# Patient Record
Sex: Female | Born: 1970 | Race: White | Hispanic: No | Marital: Married | Smoking: Former smoker
Health system: Southern US, Community
[De-identification: ages and names within clinical notes are randomized; demographics above are authoritative.]

## PROBLEM LIST (undated history)

## (undated) DIAGNOSIS — M7732 Calcaneal spur, left foot: Secondary | ICD-10-CM

## (undated) DIAGNOSIS — M79671 Pain in right foot: Secondary | ICD-10-CM

## (undated) DIAGNOSIS — E079 Disorder of thyroid, unspecified: Secondary | ICD-10-CM

## (undated) DIAGNOSIS — M216X1 Other acquired deformities of right foot: Secondary | ICD-10-CM

## (undated) DIAGNOSIS — R51 Headache: Secondary | ICD-10-CM

## (undated) DIAGNOSIS — R519 Headache, unspecified: Secondary | ICD-10-CM

## (undated) DIAGNOSIS — G8929 Other chronic pain: Secondary | ICD-10-CM

## (undated) DIAGNOSIS — K219 Gastro-esophageal reflux disease without esophagitis: Secondary | ICD-10-CM

## (undated) DIAGNOSIS — N871 Moderate cervical dysplasia: Secondary | ICD-10-CM

## (undated) DIAGNOSIS — M7731 Calcaneal spur, right foot: Secondary | ICD-10-CM

## (undated) DIAGNOSIS — M7661 Achilles tendinitis, right leg: Secondary | ICD-10-CM

## (undated) DIAGNOSIS — IMO0002 Reserved for concepts with insufficient information to code with codable children: Secondary | ICD-10-CM

## (undated) DIAGNOSIS — E892 Postprocedural hypoparathyroidism: Secondary | ICD-10-CM

## (undated) DIAGNOSIS — E039 Hypothyroidism, unspecified: Secondary | ICD-10-CM

## (undated) HISTORY — DX: Headache, unspecified: R51.9

## (undated) HISTORY — DX: Other chronic pain: G89.29

## (undated) HISTORY — DX: Moderate cervical dysplasia: N87.1

## (undated) HISTORY — DX: Reserved for concepts with insufficient information to code with codable children: IMO0002

## (undated) HISTORY — DX: Disorder of thyroid, unspecified: E07.9

## (undated) HISTORY — PX: TUBAL LIGATION: SHX77

## (undated) HISTORY — DX: Headache: R51

## (undated) HISTORY — PX: ABDOMINAL HYSTERECTOMY: SHX81

## (undated) HISTORY — PX: OTHER SURGICAL HISTORY: SHX169

---

## 1995-09-16 HISTORY — PX: TONSILLECTOMY: SUR1361

## 2010-03-11 ENCOUNTER — Ambulatory Visit: Payer: Self-pay

## 2010-08-20 ENCOUNTER — Ambulatory Visit: Payer: Self-pay | Admitting: Otolaryngology

## 2011-09-12 ENCOUNTER — Emergency Department: Payer: Self-pay | Admitting: Emergency Medicine

## 2011-09-16 HISTORY — PX: CHOLECYSTECTOMY: SHX55

## 2011-11-26 ENCOUNTER — Ambulatory Visit: Payer: Self-pay | Admitting: Otolaryngology

## 2011-12-09 ENCOUNTER — Other Ambulatory Visit: Payer: Self-pay | Admitting: Otolaryngology

## 2011-12-09 LAB — PROTIME-INR
INR: 0.9
Prothrombin Time: 12.6 secs (ref 11.5–14.7)

## 2011-12-09 LAB — PLATELET COUNT: Platelet: 207 10*3/uL (ref 150–440)

## 2011-12-09 LAB — APTT: Activated PTT: 26.9 secs (ref 23.6–35.9)

## 2011-12-11 ENCOUNTER — Ambulatory Visit: Payer: Self-pay | Admitting: Otolaryngology

## 2012-04-12 ENCOUNTER — Ambulatory Visit: Payer: Self-pay | Admitting: Otolaryngology

## 2012-04-12 LAB — PREGNANCY, URINE: Pregnancy Test, Urine: NEGATIVE m[IU]/mL

## 2012-04-12 LAB — CALCIUM: Calcium, Total: 8.3 mg/dL — ABNORMAL LOW (ref 8.5–10.1)

## 2012-04-14 LAB — PATHOLOGY REPORT

## 2014-10-24 ENCOUNTER — Emergency Department: Payer: Self-pay | Admitting: Emergency Medicine

## 2014-11-29 DIAGNOSIS — J309 Allergic rhinitis, unspecified: Secondary | ICD-10-CM | POA: Insufficient documentation

## 2014-11-29 DIAGNOSIS — K649 Unspecified hemorrhoids: Secondary | ICD-10-CM | POA: Insufficient documentation

## 2014-11-29 DIAGNOSIS — G44219 Episodic tension-type headache, not intractable: Secondary | ICD-10-CM | POA: Insufficient documentation

## 2014-11-29 DIAGNOSIS — E890 Postprocedural hypothyroidism: Secondary | ICD-10-CM | POA: Insufficient documentation

## 2014-11-29 DIAGNOSIS — G44221 Chronic tension-type headache, intractable: Secondary | ICD-10-CM | POA: Insufficient documentation

## 2014-11-29 DIAGNOSIS — J3089 Other allergic rhinitis: Secondary | ICD-10-CM | POA: Insufficient documentation

## 2015-01-02 NOTE — Op Note (Signed)
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   PROCEDURE PERFORMED: Minimally invasive endoscopic-assisted total thyroidectomy with laryngeal nerve monitoring.   SURGEON: Bud Facereighton Kineta Fudala, MD   ASSISTANT: Karlyne GreenspanWilliam Idan Prime, MD  ANESTHESIA: General endotracheal anesthesia.   ESTIMATED BLOOD LOSS: 25 mL.   IV FLUIDS: Please see anesthesia record.   COMPLICATIONS: None.   DRAINS/STENT PLACEMENTS: Bilateral Surgicel.   SPECIMENS: Total thyroid.   COMPLICATIONS: None.  INDICATIONS FOR PROCEDURE: The patient is a 44 year old female with history of a nontoxic multinodular goiter status post FNA biopsy, which was benign, but the patient continued to have compressive symptoms and enlargement of the goiter.   OPERATIVE FINDINGS: Bilateral recurrent laryngeal nerves were identified and preserved and stimulated at the end of the case. The right superior, inferior and the left inferior parathyroid glands were identified and preserved.   DESCRIPTION OF PROCEDURE: After the patient was identified in holding, benefits and risks of the procedure were discussed as well as normal postoperative course, the patient was taken to the Operating Room and placed in the supine position. General endotracheal anesthesia was induced. The patient was rotated 180 degrees. The patient was prepped and draped in sterile fashion. A previously marked anterior neck crease was injected with 6.5 mL 0.25% Marcaine with 1:200,000 epinephrine. A 15 blade scalpel was used to make an incision in an already existing skin crease and dissection was carried through subcutaneous tissues down to the level of the strap muscles using Bovie electrocautery. Strap muscles were divided along  their median raphe using Bovie electrocautery and the anterior border of the thyroid gland was encountered. The left sternohyoid and sternothyroid muscles were separated from the thyroid gland. This revealed a large goiter with extensive fibrotic scarring most likely from previous biopsies. At this time, under endoscopic visualization, the superior pole was identified and dissection was performed laterally as well as medially to the superior pole and using a Harmonic scalpel the superior pole vessels were ligated and the superior pole was mobilized. The left hemithyroid was continued to be mobilized superiorly and inferiorly and the recurrent laryngeal nerve was identified in its normal anatomical position. This was reinforced with bipolar stimulation with appropriate response. The inferior parathyroid gland was identified in its normal anatomical position. At this time, the remaining attachments of the left hemithyroid were dissected away from the trachea and the recurrent laryngeal nerve using a combination of Bovie and Harmonic scalpel. At this time, attention was directed to patient's right hemithyroid. The sternohyoid and sternothyroid muscles were separated from the anterior border of the right hemithyroid. The right hemithyroid was mobilized inferiorly and superiorly. The superior vessels were isolated laterally and medially through combination of blunt and sharp techniques. Under endoscopic visualization, the patient's right superior pole was mobilized and Harmonic scalpel was used to ligate the superior vessels. Of note there was extensive fibrotic scarring of the overlying strap muscles to the anterior border of the right hemithyroid most likely from previous biopsy sites. At this time, the thyroid was delivered through the wound and the recurrent laryngeal nerve was identified in its normal anatomical position in the tracheoesophageal groove entering into the larynx. The patient's recurrent laryngeal  nerve was stimulated robustly and the superior and inferior parathyroid glands were identified and protected and the remaining attachments of the right  hemithyroid were separated. A small cuff of tissue was needed to be left as this was scarred down to the nerve at its entrance into the patient's larynx. At this time, the remaining attachments of the isthmus and the right hemithyroid were separated using a combination of Bovie electrocautery and Harmonic scalpel and the specimen was passed off the table with a marking stitch in the left superior pole. At this time, the patient's wound was copiously irrigated and meticulous hemostasis was achieved. Surgicel was placed along the area of Berry's ligament bilaterally. Strap muscles were reapproximated with a single figure-of-eight stitch, the skin was reapproximated with three interrupted subdermal stitches, and the skin was closed with Dermabond skin adhesive and topped with a Steri-Strip. At this time, care of the patient was transferred to anesthesia.  ____________________________ Kyung Rudd, MD ccv:slb D: 04/12/2012 10:36:34 ET T: 04/12/2012 12:46:52 ET JOB#: [id number]  cc: Kyung Rudd, MD, <Dictator> Kyung Rudd MD ELECTRONICALLY SIGNED 04/12/2012 17:13

## 2015-01-05 ENCOUNTER — Ambulatory Visit
Admit: 2015-01-05 | Disposition: A | Discharge status: Home / Self Care | Payer: Self-pay | Attending: Internal Medicine | Admitting: Internal Medicine

## 2015-01-14 DIAGNOSIS — IMO0002 Reserved for concepts with insufficient information to code with codable children: Secondary | ICD-10-CM

## 2015-01-14 HISTORY — DX: Reserved for concepts with insufficient information to code with codable children: IMO0002

## 2015-02-14 DIAGNOSIS — N871 Moderate cervical dysplasia: Secondary | ICD-10-CM

## 2015-02-14 HISTORY — DX: Moderate cervical dysplasia: N87.1

## 2015-05-03 ENCOUNTER — Encounter: Payer: Self-pay | Admitting: Obstetrics and Gynecology

## 2015-05-03 ENCOUNTER — Ambulatory Visit (INDEPENDENT_AMBULATORY_CARE_PROVIDER_SITE_OTHER): Payer: Managed Care, Other (non HMO) | Admitting: Obstetrics and Gynecology

## 2015-05-03 VITALS — BP 116/73 | HR 89 | Ht 64.0 in | Wt 184.1 lb

## 2015-05-03 DIAGNOSIS — D069 Carcinoma in situ of cervix, unspecified: Secondary | ICD-10-CM

## 2015-05-03 NOTE — Patient Instructions (Signed)
1.  Avoid tobacco products. 2.  Maintain monogamous state. 3.  LEEP cone biopsy is recommended treatment for CIN 2-3With 90% success after 1 treatment, 99% success after 2 treatments (if necessary)

## 2015-05-03 NOTE — Progress Notes (Signed)
Patient ID: Abigail Lawson, female   DOB: Oct 22, 1970, 44 y.o.   MRN: [id number]   Wants options   New patient evaluation:  Chief complaint: 1.  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  The patient is a 44 year old married, white, female, para 39, using BTL for contraception, who presents for second opinion regarding management of high-grade dysplasia.  Hgsil/pos pap -01/2015- no pap in 15 years Seen at [facility] colpo- 02/2015- showed cin 2-3  GYN history: Cycles regular on a monthly basis. No significant dysmenorrhea. No menorrhagia; bleeding lasts 5-7 days. No history of chronic pelvic pain or deep thrusting dyspareunia. No history of prior abnormal Pap smears.  Review of notes from [facility] demonstrates the patient to have CIN 2-3 on colposcopic directed biopsies.  Colposcopy showed dense white epithelium circumferentially.  Past Medical History  Diagnosis Date  . 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 Thyroid disease   . Chronic headaches    Past Surgical History  Procedure Laterality Date  . Tubal ligation    . Cholecystectomy  2013  . Thyroidectomy      total  . Tonsillectomy  1997    Options were given regarding management including LEEP cone biopsy, cold knife conization, and vaginal hysterectomy.  The patient opted for LEEP cone biopsy. However, she presents today because of multiple questions regarding her condition.  IMPRESSION: 1.  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  PLAN: 1.  HPV pathophysiology, medical management, and surgical treatment of high-grade dysplasia was reviewed. 2.  Avoid tobacco products. 3.  Maintain monogamous state. 4.  Optimize immunity through healthy lifestyle. 5.  LEEP cone biopsy is my recommended treatment for this condition considering the patient does not have other gynecologic issues  warranting further aggressive treatment.  (I.e. Hysterectomy).  The patient does understand that LEEP cone biopsy or cold knife conization confers 90% success in treatment; 2 procedures would confer 99% success in treatment.  A total of 20 minutes were spent face-to-face with the patient during the encounter with greater than 50% dealing with counseling and coordination of care.  Herold Harms, MD

## 2015-07-02 NOTE — H&P (Signed)
  Chief Complaint:    Ms. Abigail Lawson is a 44 y.o. female here forTVH and bilateral salpingectomy for a LEEP that showed extensive cx dysplasia cin 2-3 deep into the hat specimen and + ECC   Past Medical History:  has a past medical history of Thyroid disease; Chronic headaches; and H/O thyroid nodule.  Past Surgical History:  has past surgical history that includes Thyroidectomy Total (03/2012); Cholecystectomy (2013); Tubal ligation; and Cervical biopsy w/ loop electrode excision (05/17/15). Family History: family history includes Cancer in her father; No Known Problems in her mother. Social History:  reports that she has quit smoking. She has never used smokeless tobacco. She reports that she does not drink alcohol or use illicit drugs. OB/GYN History:  OB History    Gravida Para Term Preterm AB TAB SAB Ectopic Multiple Living   4 3 3  1  1   3       Allergies: has No Known Allergies. Medications:  Current outpatient prescriptions:  . calcitRIOL (ROCALTROL) 0.25 MCG capsule, Take 0.25 mcg by mouth once daily., Disp: , Rfl:  . CALCIUM CARBONATE (CALCIUM 500 ORAL), Take 500 mg by mouth once daily., Disp: , Rfl:  . levothyroxine (SYNTHROID, LEVOTHROID) 100 MCG tablet, Take 100 mcg by mouth once daily. Take on an empty stomach with a glass of water at least 30-60 minutes before breakfast., Disp: , Rfl:  . HYDROcodone-acetaminophen (VICODIN) 5-300 mg Tab tablet, Take 1 tablet by mouth every 6 (six) minutes as needed., Disp: 15 tablet, Rfl: 0  Review of Systems: General:   No fatigue or weight loss Eyes:   No vision changes Ears:   No hearing difficulty Respiratory:   No cough or shortness of breath Pulmonary:   No asthma or shortness of breath Cardiovascular:  No chest pain, palpitations, dyspnea on exertion Gastrointestinal:  No abdominal bloating, chronic diarrhea, constipations, masses, pain or hematochezia Genitourinary:  No hematuria, dysuria,  abnormal vaginal discharge, pelvic pain, Menometrorrhagia+ CIN 2-3  Lymphatic:  No swollen lymph nodes Musculoskeletal: No muscle weakness Neurologic:  No extremity weakness, syncope, seizure disorder Psychiatric:  No history of depression, delusions or suicidal/homicidal ideation   Exam:      Filed Vitals:   07/03/15  BP: 102/72  Pulse: 80    Body mass index is 31.4 kg/(m^2).  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 Has been counseled regarding the risks and benefits and wishes to proceed with the surgery        Vilma PraderHOMAS JANSE Renalda Locklin, MD

## 2015-07-03 ENCOUNTER — Encounter
Admission: RE | Admit: 2015-07-03 | Discharge: 2015-07-03 | Disposition: A | Discharge status: Home / Self Care | Payer: Managed Care, Other (non HMO) | Source: Ambulatory Visit | Attending: Obstetrics and Gynecology | Admitting: Obstetrics and Gynecology

## 2015-07-03 DIAGNOSIS — Z01818 Encounter for other preprocedural examination: Secondary | ICD-10-CM | POA: Diagnosis present

## 2015-07-03 DIAGNOSIS — N879 Dysplasia of cervix uteri, unspecified: Secondary | ICD-10-CM | POA: Insufficient documentation

## 2015-07-03 HISTORY — DX: Hypothyroidism, unspecified: E03.9

## 2015-07-03 HISTORY — DX: Gastro-esophageal reflux disease without esophagitis: K21.9

## 2015-07-03 LAB — TYPE AND SCREEN
ABO/RH(D): B NEG
Antibody Screen: NEGATIVE

## 2015-07-03 LAB — BASIC METABOLIC PANEL
ANION GAP: 8 (ref 5–15)
BUN: 9 mg/dL (ref 6–20)
CALCIUM: 7.8 mg/dL — AB (ref 8.9–10.3)
CO2: 27 mmol/L (ref 22–32)
CREATININE: 0.87 mg/dL (ref 0.44–1.00)
Chloride: 104 mmol/L (ref 101–111)
GLUCOSE: 92 mg/dL (ref 65–99)
Potassium: 4 mmol/L (ref 3.5–5.1)
Sodium: 139 mmol/L (ref 135–145)

## 2015-07-03 LAB — CBC
HCT: 33.8 % — ABNORMAL LOW (ref 35.0–47.0)
HEMOGLOBIN: 11.2 g/dL — AB (ref 12.0–16.0)
MCH: 26.6 pg (ref 26.0–34.0)
MCHC: 33 g/dL (ref 32.0–36.0)
MCV: 80.5 fL (ref 80.0–100.0)
PLATELETS: 225 10*3/uL (ref 150–440)
RBC: 4.2 MIL/uL (ref 3.80–5.20)
RDW: 14.5 % (ref 11.5–14.5)
WBC: 4.8 10*3/uL (ref 3.6–11.0)

## 2015-07-03 LAB — ABO/RH: ABO/RH(D): B NEG

## 2015-07-03 NOTE — Patient Instructions (Addendum)
  Your procedure is scheduled on: 06/2415 Mon Report to Day Surgery.2nd floor [facility] To find out your arrival time please call [phone] between 1PM - 3PM on 07/06/15 Fri Remember: Instructions that are not followed completely may result in serious medical risk, up to and including death, or upon the discretion of your surgeon and anesthesiologist your surgery may need to be rescheduled.    _x___ 1. Do not eat food or drink liquids after midnight. No gum chewing or hard candies.     __x__ 2. No Alcohol for 24 hours before or after surgery.   ____ 3. Bring all medications with you on the day of surgery if instructed.    ___x_ 4. Notify your doctor if there is any change in your medical condition     (cold, fever, infections).     Do not wear jewelry, make-up, hairpins, clips or nail polish.  Do not wear lotions, powders, or perfumes. You may wear deodorant.  Do not shave 48 hours prior to surgery. Men may shave face and neck.  Do not bring valuables to the hospital.    [facility] any belongings or valuables.               Contacts, dentures or bridgework may not be worn into surgery.  Leave your suitcase in the car. After surgery it may be brought to your room.  For patients admitted to the hospital, discharge time is determined by your                treatment team.   Patients discharged the day of surgery will not be allowed to drive home.   Please read over the following fact sheets that you were given:      __x__ Take these medicines the morning of surgery with A SIP OF WATER:    1. famotidine (PEPCID) 10 MG tablet  2. levothyroxine (SYNTHROID, LEVOTHROID) 100 MCG tablet  3.   4.  5.  6.  __x__ Fleet Enema (as directed)   _x___ Use CHG Soap as directed  ____ Use inhalers on the day of surgery  ____ Stop metformin 2 days prior to surgery    ____ Take 1/2 of usual insulin dose the night before surgery and none on the morning of  surgery.   ____ Stop Coumadin/Plavix/aspirin on   _x___ Stop Anti-inflammatories on Only take Tylenol as needed   ____ Stop supplements until after surgery.    ____ Bring C-Pap to the hospital.

## 2015-07-03 NOTE — Pre-Procedure Instructions (Signed)
Faxed calcium result to Dr Feliberto GottronSchermerhorn.

## 2015-07-09 ENCOUNTER — Ambulatory Visit: Payer: Managed Care, Other (non HMO) | Admitting: Certified Registered Nurse Anesthetist

## 2015-07-09 ENCOUNTER — Encounter: Payer: Self-pay | Admitting: *Deleted

## 2015-07-09 ENCOUNTER — Encounter
Admission: RE | Disposition: A | Discharge status: Home / Self Care | Payer: Self-pay | Source: Ambulatory Visit | Attending: Obstetrics and Gynecology

## 2015-07-09 ENCOUNTER — Observation Stay
Admission: RE | Admit: 2015-07-09 | Discharge: 2015-07-10 | Disposition: A | Discharge status: Home / Self Care | Payer: Managed Care, Other (non HMO) | Source: Ambulatory Visit | Attending: Obstetrics and Gynecology | Admitting: Obstetrics and Gynecology

## 2015-07-09 DIAGNOSIS — N800 Endometriosis of uterus: Secondary | ICD-10-CM | POA: Diagnosis not present

## 2015-07-09 DIAGNOSIS — Z79899 Other long term (current) drug therapy: Secondary | ICD-10-CM | POA: Insufficient documentation

## 2015-07-09 DIAGNOSIS — K219 Gastro-esophageal reflux disease without esophagitis: Secondary | ICD-10-CM | POA: Diagnosis not present

## 2015-07-09 DIAGNOSIS — Z809 Family history of malignant neoplasm, unspecified: Secondary | ICD-10-CM | POA: Insufficient documentation

## 2015-07-09 DIAGNOSIS — D069 Carcinoma in situ of cervix, unspecified: Secondary | ICD-10-CM | POA: Diagnosis present

## 2015-07-09 DIAGNOSIS — Z9049 Acquired absence of other specified parts of digestive tract: Secondary | ICD-10-CM | POA: Insufficient documentation

## 2015-07-09 DIAGNOSIS — E890 Postprocedural hypothyroidism: Secondary | ICD-10-CM | POA: Insufficient documentation

## 2015-07-09 DIAGNOSIS — N831 Corpus luteum cyst of ovary, unspecified side: Secondary | ICD-10-CM | POA: Insufficient documentation

## 2015-07-09 DIAGNOSIS — Z87891 Personal history of nicotine dependence: Secondary | ICD-10-CM | POA: Diagnosis not present

## 2015-07-09 DIAGNOSIS — Z9889 Other specified postprocedural states: Secondary | ICD-10-CM

## 2015-07-09 HISTORY — PX: OOPHORECTOMY: SHX6387

## 2015-07-09 HISTORY — PX: VAGINAL HYSTERECTOMY: SHX2639

## 2015-07-09 HISTORY — PX: BILATERAL SALPINGECTOMY: SHX5743

## 2015-07-09 LAB — POCT PREGNANCY, URINE: Preg Test, Ur: NEGATIVE

## 2015-07-09 SURGERY — HYSTERECTOMY, VAGINAL
Anesthesia: General | Laterality: Right

## 2015-07-09 MED ORDER — MORPHINE SULFATE 2 MG/ML IV SOLN: INTRAVENOUS | Status: DC

## 2015-07-09 MED ORDER — ONDANSETRON HCL 4 MG/2ML IJ SOLN
INTRAMUSCULAR | Status: DC | PRN
  Administered 2015-07-09: 4 mg via INTRAVENOUS

## 2015-07-09 MED ORDER — LACTATED RINGERS IV SOLN: INTRAVENOUS | Status: DC

## 2015-07-09 MED ORDER — FENTANYL CITRATE (PF) 100 MCG/2ML IJ SOLN
INTRAMUSCULAR | Status: DC | PRN
  Administered 2015-07-09 (×2): 100 ug via INTRAVENOUS
  Administered 2015-07-09: 50 ug via INTRAVENOUS

## 2015-07-09 MED ORDER — CEFOXITIN SODIUM-DEXTROSE 2-2.2 GM-% IV SOLR (PREMIX)
INTRAVENOUS | Status: AC
  Filled 2015-07-09: qty 50

## 2015-07-09 MED ORDER — LIDOCAINE-EPINEPHRINE 1 %-1:100000 IJ SOLN
INTRAMUSCULAR | Status: AC
  Filled 2015-07-09: qty 1

## 2015-07-09 MED ORDER — DIPHENHYDRAMINE HCL 12.5 MG/5ML PO ELIX
12.5000 mg | ORAL_SOLUTION | Freq: Four times a day (QID) | ORAL | Status: DC | PRN
Start: 2015-07-09 — End: 2015-07-10
  Filled 2015-07-09: qty 5

## 2015-07-09 MED ORDER — ROCURONIUM BROMIDE 100 MG/10ML IV SOLN
INTRAVENOUS | Status: DC | PRN
  Administered 2015-07-09: 10 mg via INTRAVENOUS
  Administered 2015-07-09: 20 mg via INTRAVENOUS

## 2015-07-09 MED ORDER — LIDOCAINE HCL (CARDIAC) 20 MG/ML IV SOLN
INTRAVENOUS | Status: DC | PRN
  Administered 2015-07-09: 100 mg via INTRAVENOUS

## 2015-07-09 MED ORDER — KETOROLAC TROMETHAMINE 30 MG/ML IJ SOLN
INTRAMUSCULAR | Status: DC | PRN
  Administered 2015-07-09: 30 mg via INTRAVENOUS

## 2015-07-09 MED ORDER — FENTANYL CITRATE (PF) 100 MCG/2ML IJ SOLN
INTRAMUSCULAR | Status: AC
  Filled 2015-07-09: qty 2

## 2015-07-09 MED ORDER — LACTATED RINGERS IV SOLN
INTRAVENOUS | Status: DC
  Administered 2015-07-09 – 2015-07-10 (×2): via INTRAVENOUS

## 2015-07-09 MED ORDER — OXYCODONE HCL 5 MG/5ML PO SOLN: 5.0000 mg | Freq: Once | ORAL | Status: DC | PRN

## 2015-07-09 MED ORDER — NEOSTIGMINE METHYLSULFATE 10 MG/10ML IV SOLN
INTRAVENOUS | Status: DC | PRN
  Administered 2015-07-09: 2 mg via INTRAVENOUS

## 2015-07-09 MED ORDER — MORPHINE SULFATE (PF) 2 MG/ML IV SOLN
1.0000 mg | INTRAVENOUS | Status: DC | PRN
  Administered 2015-07-09: 2 mg via INTRAVENOUS
  Administered 2015-07-09: 1 mg via INTRAVENOUS
  Administered 2015-07-09: 2 mg via INTRAVENOUS
  Filled 2015-07-09 (×3): qty 1

## 2015-07-09 MED ORDER — DEXTROSE 5 % IV SOLN
2.0000 g | INTRAVENOUS | Status: DC | PRN
  Administered 2015-07-09: 2 g via INTRAVENOUS

## 2015-07-09 MED ORDER — HYDROMORPHONE HCL 1 MG/ML IJ SOLN
INTRAMUSCULAR | Status: AC
  Filled 2015-07-09: qty 1

## 2015-07-09 MED ORDER — ALUM & MAG HYDROXIDE-SIMETH 200-200-20 MG/5ML PO SUSP: 30.0000 mL | ORAL | Status: DC | PRN

## 2015-07-09 MED ORDER — SIMETHICONE 80 MG PO CHEW: 80.0000 mg | CHEWABLE_TABLET | Freq: Four times a day (QID) | ORAL | Status: DC | PRN

## 2015-07-09 MED ORDER — ONDANSETRON HCL 4 MG/2ML IJ SOLN: 4.0000 mg | Freq: Four times a day (QID) | INTRAMUSCULAR | Status: DC | PRN

## 2015-07-09 MED ORDER — OXYCODONE-ACETAMINOPHEN 5-325 MG PO TABS
1.0000 | ORAL_TABLET | ORAL | Status: DC | PRN
  Administered 2015-07-09 – 2015-07-10 (×4): 2 via ORAL
  Filled 2015-07-09 (×4): qty 2

## 2015-07-09 MED ORDER — DIPHENHYDRAMINE HCL 50 MG/ML IJ SOLN: 12.5000 mg | Freq: Four times a day (QID) | INTRAMUSCULAR | Status: DC | PRN

## 2015-07-09 MED ORDER — CEFOXITIN SODIUM-DEXTROSE 2-2.2 GM-% IV SOLR (PREMIX): 2.0000 g | INTRAVENOUS | Status: DC

## 2015-07-09 MED ORDER — MIDAZOLAM HCL 2 MG/2ML IJ SOLN
INTRAMUSCULAR | Status: DC | PRN
  Administered 2015-07-09: 2 mg via INTRAVENOUS

## 2015-07-09 MED ORDER — LIDOCAINE-EPINEPHRINE 1 %-1:100000 IJ SOLN
INTRAMUSCULAR | Status: DC | PRN
  Administered 2015-07-09: 8 mL

## 2015-07-09 MED ORDER — ONDANSETRON HCL 4 MG PO TABS: 4.0000 mg | ORAL_TABLET | Freq: Four times a day (QID) | ORAL | Status: DC | PRN

## 2015-07-09 MED ORDER — PROPOFOL 10 MG/ML IV BOLUS
INTRAVENOUS | Status: DC | PRN
  Administered 2015-07-09: 200 mg via INTRAVENOUS

## 2015-07-09 MED ORDER — GLYCOPYRROLATE 0.2 MG/ML IJ SOLN
INTRAMUSCULAR | Status: DC | PRN
  Administered 2015-07-09: .2 mg via INTRAVENOUS

## 2015-07-09 MED ORDER — SODIUM CHLORIDE 0.9 % IJ SOLN: 9.0000 mL | INTRAMUSCULAR | Status: DC | PRN

## 2015-07-09 MED ORDER — LACTATED RINGERS IV SOLN
INTRAVENOUS | Status: DC
  Administered 2015-07-09 (×2): via INTRAVENOUS

## 2015-07-09 MED ORDER — DEXAMETHASONE SODIUM PHOSPHATE 4 MG/ML IJ SOLN
INTRAMUSCULAR | Status: DC | PRN
  Administered 2015-07-09: 8 mg via INTRAVENOUS

## 2015-07-09 MED ORDER — HYDROMORPHONE HCL 1 MG/ML IJ SOLN
0.5000 mg | INTRAMUSCULAR | Status: AC | PRN
  Administered 2015-07-09 (×4): 0.5 mg via INTRAVENOUS

## 2015-07-09 MED ORDER — NALOXONE HCL 0.4 MG/ML IJ SOLN: 0.4000 mg | INTRAMUSCULAR | Status: DC | PRN

## 2015-07-09 MED ORDER — FLEET ENEMA 7-19 GM/118ML RE ENEM: 1.0000 | ENEMA | Freq: Once | RECTAL | Status: DC

## 2015-07-09 MED ORDER — IBUPROFEN 800 MG PO TABS
800.0000 mg | ORAL_TABLET | Freq: Three times a day (TID) | ORAL | Status: DC | PRN
  Administered 2015-07-09 – 2015-07-10 (×2): 800 mg via ORAL
  Filled 2015-07-09 (×2): qty 1

## 2015-07-09 MED ORDER — ACETAMINOPHEN 10 MG/ML IV SOLN
INTRAVENOUS | Status: AC
  Filled 2015-07-09: qty 100

## 2015-07-09 MED ORDER — OXYCODONE HCL 5 MG PO TABS: 5.0000 mg | ORAL_TABLET | Freq: Once | ORAL | Status: DC | PRN

## 2015-07-09 MED ORDER — FENTANYL CITRATE (PF) 100 MCG/2ML IJ SOLN
25.0000 ug | INTRAMUSCULAR | Status: DC | PRN
  Administered 2015-07-09: 25 ug via INTRAVENOUS
  Administered 2015-07-09 (×2): 50 ug via INTRAVENOUS
  Administered 2015-07-09: 25 ug via INTRAVENOUS

## 2015-07-09 SURGICAL SUPPLY — 30 items
BAG URO DRAIN 2000ML W/SPOUT (MISCELLANEOUS) ×5 IMPLANT
CANISTER SUCT 1200ML W/VALVE (MISCELLANEOUS) ×5 IMPLANT
CATH FOLEY 2WAY  5CC 16FR (CATHETERS) ×2
CATH ROBINSON RED A/P 16FR (CATHETERS) ×5 IMPLANT
CATH URTH 16FR FL 2W BLN LF (CATHETERS) ×3 IMPLANT
COUNTER NEEDLE 20/40 LG (NEEDLE) ×5 IMPLANT
DRAPE PERI LITHO V/GYN (MISCELLANEOUS) ×5 IMPLANT
DRAPE SURG 17X11 SM STRL (DRAPES) ×5 IMPLANT
DRAPE UNDER BUTTOCK W/FLU (DRAPES) ×5 IMPLANT
GLOVE BIO SURGEON STRL SZ8 (GLOVE) ×30 IMPLANT
GOWN STRL REUS W/ TWL LRG LVL3 (GOWN DISPOSABLE) ×9 IMPLANT
GOWN STRL REUS W/ TWL XL LVL3 (GOWN DISPOSABLE) ×3 IMPLANT
GOWN STRL REUS W/TWL LRG LVL3 (GOWN DISPOSABLE) ×6
GOWN STRL REUS W/TWL XL LVL3 (GOWN DISPOSABLE) ×2
KIT RM TURNOVER CYSTO AR (KITS) ×5 IMPLANT
LABEL OR SOLS (LABEL) ×5 IMPLANT
NDL SAFETY 22GX1.5 (NEEDLE) ×5 IMPLANT
PACK BASIN MINOR ARMC (MISCELLANEOUS) ×5 IMPLANT
PAD GROUND ADULT SPLIT (MISCELLANEOUS) ×5 IMPLANT
PAD OB MATERNITY 4.3X12.25 (PERSONAL CARE ITEMS) ×5 IMPLANT
PAD PREP 24X41 OB/GYN DISP (PERSONAL CARE ITEMS) ×5 IMPLANT
SUT PDS 2-0 27IN (SUTURE) ×5 IMPLANT
SUT VIC AB 0 CT1 27 (SUTURE) ×6
SUT VIC AB 0 CT1 27XCR 8 STRN (SUTURE) ×9 IMPLANT
SUT VIC AB 0 CT1 36 (SUTURE) ×5 IMPLANT
SUT VIC AB 2-0 SH 27 (SUTURE) ×4
SUT VIC AB 2-0 SH 27XBRD (SUTURE) ×6 IMPLANT
SYR CONTROL 10ML (SYRINGE) ×5 IMPLANT
SYRINGE 10CC LL (SYRINGE) ×5 IMPLANT
WATER STERILE IRR 1000ML POUR (IV SOLUTION) ×5 IMPLANT

## 2015-07-09 NOTE — Progress Notes (Signed)
DOS TVH + RSO + left salpingectomy  ++ pain . She thinks it is the catheter. 10/10 VSS Abd soft , non distended . UO good  Perineum no blood  A: hemodynamically stable  P: pt just received motrin and percocet . IF pain still >6+/10 I will order a morphine PCA D/c foley

## 2015-07-09 NOTE — Anesthesia Procedure Notes (Signed)
Procedure Name: Intubation Performed by: Ginger CarneMICHELET, Eden Rho Pre-anesthesia Checklist: Patient identified, Emergency Drugs available, Suction available and Patient being monitored Patient Re-evaluated:Patient Re-evaluated prior to inductionOxygen Delivery Method: Circle system utilized Preoxygenation: Pre-oxygenation with 100% oxygen Intubation Type: IV induction Ventilation: Mask ventilation without difficulty Laryngoscope Size: Miller and 2 Grade View: Grade II Tube type: Oral Tube size: 7.0 mm Number of attempts: 1 Airway Equipment and Method: Stylet Placement Confirmation: ETT inserted through vocal cords under direct vision Secured at: 21 cm Tube secured with: Tape Dental Injury: Teeth and Oropharynx as per pre-operative assessment

## 2015-07-09 NOTE — Anesthesia Preprocedure Evaluation (Signed)
Anesthesia Evaluation  Patient identified by MRN, date of birth, ID band Patient awake    Reviewed: Allergy & Precautions, H&P , NPO status , Patient's Chart, lab work & pertinent test results  Airway Mallampati: II  TM Distance: >3 FB Neck ROM: full    Dental no notable dental hx. (+) Teeth Intact   Pulmonary neg shortness of breath, former smoker,    Pulmonary exam normal breath sounds clear to auscultation       Cardiovascular Exercise Tolerance: Good (-) angina(-) Past MI and (-) DOE negative cardio ROS Normal cardiovascular exam Rhythm:regular Rate:Normal     Neuro/Psych  Headaches, negative psych ROS   GI/Hepatic Neg liver ROS, GERD  Controlled and Medicated,  Endo/Other  Hypothyroidism   Renal/GU negative Renal ROS  negative genitourinary   Musculoskeletal   Abdominal   Peds  Hematology negative hematology ROS (+)   Anesthesia Other Findings Past Medical History:   HGSIL (high grade squamous intraepithelial dys* 01/2015         Comment:pos hpv   Dysplasia of cervix, high grade CIN 2           02/2015         Comment:cin 2-3 on colpo   Thyroid disease                                              Chronic headaches                                            Hypothyroidism                                               GERD (gastroesophageal reflux disease)                      Past Surgical History:   TUBAL LIGATION                                                CHOLECYSTECTOMY                                  2013         thyroidectomy                                                   Comment:total   TONSILLECTOMY                                    1997           Reproductive/Obstetrics negative OB ROS  Anesthesia Physical Anesthesia Plan  ASA: III  Anesthesia Plan: General ETT   Post-op Pain Management:    Induction:   Airway Management  Planned:   Additional Equipment:   Intra-op Plan:   Post-operative Plan:   Informed Consent: I have reviewed the patients History and Physical, chart, labs and discussed the procedure including the risks, benefits and alternatives for the proposed anesthesia with the patient or authorized representative who has indicated his/her understanding and acceptance.   Dental Advisory Given  Plan Discussed with: Anesthesiologist, CRNA and Surgeon  Anesthesia Plan Comments:         Anesthesia Quick Evaluation

## 2015-07-09 NOTE — Brief Op Note (Signed)
07/09/2015  12:19 PM  PATIENT:  Abigail MooresAngela D Greenhaw  44 y.o. female  PRE-OPERATIVE DIAGNOSIS:  SEVERE CERVICAL DYSPLASIA , CIN3 + ECC   POST-OPERATIVE DIAGNOSIS:  severe cervical dysplasia  PROCEDURE:  Procedure(s): HYSTERECTOMY VAGINAL (N/A) BILATERAL SALPINGECTOMY OOPHORECTOMY (Right)  SURGEON:  Surgeon(s) and Role:    * Suzy Bouchardhomas J Schermerhorn, MD - Primary    * Christeen DouglasBethany Beasley, MD - Assisting  PHYSICIAN ASSISTANT:   ASSISTANTS: none   ANESTHESIA:   general  EBL:  Total I/O In: 900 [I.V.:900] Out: 150 [Urine:100; Blood:50]  BLOOD ADMINISTERED:none  DRAINS: Urinary Catheter (Foley)   LOCAL MEDICATIONS USED:  LIDOCAINE   SPECIMEN:  Source of Specimen:  cervix , uterus and bilateral tubes and roght ovary   DISPOSITION OF SPECIMEN:  PATHOLOGY  COUNTS:  YES  TOURNIQUET:  * No tourniquets in log *  DICTATION: .verbal  PLAN OF CARE: Admit for overnight observation  PATIENT DISPOSITION:  PACU - hemodynamically stable.   Delay start of Pharmacological VTE agent (>24hrs) due to surgical blood loss or risk of bleeding: not applicable

## 2015-07-09 NOTE — Transfer of Care (Signed)
Immediate Anesthesia Transfer of Care Note  Patient: Abigail Lawson  Procedure(s) Performed: Procedure(s): HYSTERECTOMY VAGINAL (N/A) BILATERAL SALPINGECTOMY OOPHORECTOMY (Right)  Patient Location: PACU  Anesthesia Type:General  Level of Consciousness: awake, alert  and oriented  Airway & Oxygen Therapy: Patient Spontanous Breathing and Patient connected to nasal cannula oxygen  Post-op Assessment: Report given to RN and Post -op Vital signs reviewed and stable  Post vital signs: Reviewed and stable  Last Vitals:  Filed Vitals:   07/09/15 1234  BP: 117/68  Pulse: 68  Temp: 36.2 C  Resp: 20    Complications: No apparent anesthesia complications

## 2015-07-09 NOTE — Anesthesia Postprocedure Evaluation (Signed)
  Anesthesia Post-op Note  Patient: Abigail Lawson  Procedure(s) Performed: Procedure(s): HYSTERECTOMY VAGINAL (N/A) BILATERAL SALPINGECTOMY OOPHORECTOMY (Right)  Anesthesia type:General ETT  Patient location: PACU  Post pain: Pain level controlled  Post assessment: Post-op Vital signs reviewed, Patient's Cardiovascular Status Stable, Respiratory Function Stable, Patent Airway and No signs of Nausea or vomiting  Post vital signs: Reviewed and stable  Last Vitals:  Filed Vitals:   07/09/15 1450  BP: 98/51  Pulse: 68  Temp: 36.4 C  Resp: 18    Level of consciousness: awake, alert  and patient cooperative  Complications: No apparent anesthesia complications

## 2015-07-09 NOTE — Op Note (Signed)
NAMEra Bumpers:  Blades, Theodora             ACCOUNT NO.:  [id number]  MEDICAL RECORD NO.:  [id number]  LOCATION:  [facility]                         FACILITY:  [facility]  PHYSICIAN:  Jennell Cornerhomas Shara Hartis, MDDATE OF BIRTH:  November 16, 1970  DATE OF PROCEDURE:  07/09/2015 DATE OF DISCHARGE:                              OPERATIVE REPORT   PREOPERATIVE DIAGNOSIS:  Severe cervical dysplasia.  POSTOPERATIVE DIAGNOSIS:  Severe cervical dysplasia.  PROCEDURE: 1. Total vaginal hysterectomy. 2. Bilateral salpingectomy. 3. Right oophorectomy.  ANESTHESIA:  General endotracheal anesthesia.  SURGEON:  Jennell Cornerhomas Cristel Rail, MD.  FIRST ASSISTANT:  Dalbert GarnetBeasley.  INDICATIONS:  A 44 year old, gravida 4, para 3, patient underwent a cervical LEEP procedure in Abigail office that showed CIN 2-3 positive margins and positive endocervical curettage.  DESCRIPTION OF PROCEDURE:  After adequate general endotracheal anesthesia, Abigail patient was placed in dorsal supine position.  Legs placed in Abigail candy-cane stirrups.  Lower abdomen, perineum, and vagina were prepped with Betadine.  Abigail patient did previously receive 2 g IV cefoxitin.  A time-out was performed.  Abigail patient's bladder was catheterized yielding 100 mL clear urine.  A weighted speculum was placed in Abigail posterior vaginal vault, and Abigail anterior cervix was elevated with a Deaver retractor.  Two thyroid tenacula were placed on Abigail cervix, and Abigail posterior colpotomy incision was made with direct entrance into Abigail posterior cul-de-sac.  Abigail uterosacral ligaments were bilaterally clamped, transected, suture ligated with 0 Vicryl suture and tagged for later identification.  Anterior cervix was incised with Abigail Bovie.  Cardinal ligaments were bilaterally clamped, transected, suture ligated with 0 Vicryl suture.  Abigail anterior cul-de-sac was entered sharply, and Abigail uterine arteries were then bilaterally clamped, transected, suture ligated with 0 Vicryl suture.  Serial bites  on each side of Abigail uterus ensued, and ultimately Abigail cornua were bilaterally clamped, transected, suture ligated with 0 Vicryl suture.  There was a right ovarian cyst that was bleeding that could not be controlled with a 2-0 Vicryl suture.  Therefore, Abigail infundibulopelvic ligament on Abigail right was clamped and Abigail right ovary was removed with Abigail right fallopian tube.  Abigail pedicle was ligated with 0 Vicryl suture.  Good hemostasis was noted.  Abigail patient's left fallopian tube was grasped with a Babcock clamp and Abigail left fallopian tube was removed, and Abigail pedicle was ligated with 0 Vicryl suture.  Good hemostasis was noted. Abigail peritoneum was closed with a pursestring 2-0 PDS suture, Abigail vaginal vault was then closed with a running 0 Vicryl suture, and Abigail uterosacral ligaments were plicated centrally, and Abigail rest of Abigail vaginal vault was closed.  Good hemostasis was noted.  A Foley was placed at Abigail end of Abigail case yielding clear urine.  Abigail patient did receive Toradol and IV Tylenol in Abigail operating room.  ESTIMATED BLOOD LOSS:  50 mL.  INTRAOPERATIVE FLUIDS:  900 mL.  URINE OUTPUT:  150 mL.  Abigail patient tolerated Abigail procedure well and was taken to Abigail recovery room in good condition.          ______________________________ Jennell Cornerhomas Emmersen Garraway, MD     TS/MEDQ  D:  07/09/2015  T:  07/09/2015  Job:  [id number]

## 2015-07-09 NOTE — Progress Notes (Signed)
Pt interviewed , labs reviewed  And procedure ( tvh + bilateral salpingectomy ) . All questions answered . Neg HCG. Ready to proceed >

## 2015-07-10 DIAGNOSIS — D069 Carcinoma in situ of cervix, unspecified: Secondary | ICD-10-CM | POA: Diagnosis not present

## 2015-07-10 DIAGNOSIS — Z9889 Other specified postprocedural states: Secondary | ICD-10-CM

## 2015-07-10 LAB — CBC
HCT: 30.5 % — ABNORMAL LOW (ref 35.0–47.0)
HEMOGLOBIN: 9.8 g/dL — AB (ref 12.0–16.0)
MCH: 26 pg (ref 26.0–34.0)
MCHC: 32.2 g/dL (ref 32.0–36.0)
MCV: 80.7 fL (ref 80.0–100.0)
PLATELETS: 221 10*3/uL (ref 150–440)
RBC: 3.78 MIL/uL — AB (ref 3.80–5.20)
RDW: 14.8 % — ABNORMAL HIGH (ref 11.5–14.5)
WBC: 11.8 10*3/uL — ABNORMAL HIGH (ref 3.6–11.0)

## 2015-07-10 LAB — COMPREHENSIVE METABOLIC PANEL
ALBUMIN: 3.2 g/dL — AB (ref 3.5–5.0)
ALK PHOS: 48 U/L (ref 38–126)
ALT: 32 U/L (ref 14–54)
ANION GAP: 4 — AB (ref 5–15)
AST: 26 U/L (ref 15–41)
BUN: 11 mg/dL (ref 6–20)
CALCIUM: 7.4 mg/dL — AB (ref 8.9–10.3)
CHLORIDE: 106 mmol/L (ref 101–111)
CO2: 25 mmol/L (ref 22–32)
Creatinine, Ser: 0.99 mg/dL (ref 0.44–1.00)
GFR calc Af Amer: >60 mL/min (ref >60)
GFR calc non Af Amer: >60 mL/min (ref >60)
GLUCOSE: 138 mg/dL — AB (ref 65–99)
Potassium: 4 mmol/L (ref 3.5–5.1)
SODIUM: 135 mmol/L (ref 135–145)
Total Bilirubin: 0.8 mg/dL (ref 0.3–1.2)
Total Protein: 6.3 g/dL — ABNORMAL LOW (ref 6.5–8.1)

## 2015-07-10 MED ORDER — OXYCODONE-ACETAMINOPHEN 5-325 MG PO TABS: 1.0000 | ORAL_TABLET | ORAL | Status: DC | PRN

## 2015-07-10 MED ORDER — NAPROXEN 500 MG PO TABS: 500.0000 mg | ORAL_TABLET | Freq: Two times a day (BID) | ORAL | Status: DC

## 2015-07-10 MED ORDER — DOCUSATE SODIUM 100 MG PO CAPS: 100.0000 mg | ORAL_CAPSULE | Freq: Two times a day (BID) | ORAL | Status: DC

## 2015-07-10 MED ORDER — ONDANSETRON HCL 8 MG PO TABS: 8.0000 mg | ORAL_TABLET | Freq: Three times a day (TID) | ORAL | Status: DC | PRN

## 2015-07-10 NOTE — Progress Notes (Signed)
San JettyLaurie Neese, RN spoke with Dr Dalbert GarnetBeasley about the patients low B/P and HR. She suggested given the patient a bolus of fluid (L/R), to help with pressures and heart rate. San JettyLaurie Neese, RN will follow up with Dr Feliberto GottronSchermerhorn.

## 2015-07-10 NOTE — Discharge Summary (Signed)
Physician Discharge Summary  Patient ID: Abigail Lawson MRN: [id number] DOB/AGE: 1971-04-09 44 y.o.  Admit date: 07/09/2015 Discharge date: 07/10/2015  Admission Diagnoses:  Discharge Diagnoses:  Active Problems:   Postoperative state   Discharged Condition: good  Hospital Course: underwent an uncomplicated TVH and rso and left salpingectomy . Post op nl . Slightly low HR on D/C . HCt 30% and good urine output . No CP or dizziness on d/c   Consults: None  Significant Diagnostic Studies: none  Treatments: surgery: none  Discharge Exam: Blood pressure 87/44, pulse 46, temperature 98.1 F (36.7 C), temperature source Oral, resp. rate 18, height  (1.6 m), weight 183 lb (83.008 kg), last menstrual period 06/25/2015, SpO2 100 %. Exam : Bp slightly low 87/44, p= 46 Cv brady , nl rhythm , no murmur  Lungs CTA  Abd : soft NT  Perineum , no blood   Disposition: Final discharge disposition not confirmed  Discharge Instructions    Call MD for:  difficulty breathing, headache or visual disturbances    Complete by:  As directed      Call MD for:  extreme fatigue    Complete by:  As directed      Call MD for:  hives    Complete by:  As directed      Call MD for:  persistant dizziness or light-headedness    Complete by:  As directed      Call MD for:  redness, tenderness, or signs of infection (pain, swelling, redness, odor or green/yellow discharge around incision site)    Complete by:  As directed      Call MD for:  severe uncontrolled pain    Complete by:  As directed      Call MD for:  temperature >100.4    Complete by:  As directed      Diet - low sodium heart healthy    Complete by:  As directed      Increase activity slowly    Complete by:  As directed             Medication List    TAKE these medications        calcitRIOL 0.25 MCG capsule  Commonly known as:  ROCALTROL  Take by mouth.     calcium carbonate 1250 (500 CA) MG tablet  Commonly known as:   OS-CAL - dosed in mg of elemental calcium  Take 6 tablets by mouth daily.     docusate sodium 100 MG capsule  Commonly known as:  COLACE  Take 1 capsule (100 mg total) by mouth 2 (two) times daily.     famotidine 10 MG tablet  Commonly known as:  PEPCID  Take 10 mg by mouth 2 (two) times daily as needed for heartburn or indigestion.     levothyroxine 100 MCG tablet  Commonly known as:  SYNTHROID, LEVOTHROID  Take by mouth.     multivitamin capsule  Take 1 capsule by mouth daily.     naproxen 500 MG tablet  Commonly known as:  NAPROSYN  Take 1 tablet (500 mg total) by mouth 2 (two) times daily with a meal.     ondansetron 8 MG tablet  Commonly known as:  ZOFRAN  Take 1 tablet (8 mg total) by mouth every 8 (eight) hours as needed for nausea or vomiting.     oxyCODONE-acetaminophen 5-325 MG tablet  Commonly known as:  ROXICET  Take 1-2 tablets by mouth every 4 (four)  hours as needed for severe pain.           Follow-up Information    Follow up with Davone Shinault, MD In 2 weeks.   Specialty:  Obstetrics and Gynecology   Why:  For wound re-check   Contact information:   [address] [facility] [place] [id number] [phone]       Signed: Jennell CornerSCHERMERHORN,Ashur Glatfelter 07/10/2015, 9:47 AM

## 2015-07-10 NOTE — Progress Notes (Signed)
Patient understands all discharge instructions and the need to make follow up appointments. Patient discharge via wheelchair with auxillary. 

## 2015-07-10 NOTE — Progress Notes (Signed)
Dr Feliberto GottronSchermerhorn, MD was made aware of patients B/P and HR and he is ok with sending the patient home with a follow up in 2 weeks.

## 2015-07-11 LAB — SURGICAL PATHOLOGY

## 2015-11-14 DIAGNOSIS — N183 Chronic kidney disease, stage 3 unspecified: Secondary | ICD-10-CM | POA: Insufficient documentation

## 2015-11-14 DIAGNOSIS — D500 Iron deficiency anemia secondary to blood loss (chronic): Secondary | ICD-10-CM | POA: Insufficient documentation

## 2017-03-09 ENCOUNTER — Other Ambulatory Visit: Payer: Self-pay | Admitting: Internal Medicine

## 2017-03-09 DIAGNOSIS — Z1231 Encounter for screening mammogram for malignant neoplasm of breast: Secondary | ICD-10-CM

## 2017-03-24 ENCOUNTER — Ambulatory Visit
Admission: RE | Admit: 2017-03-24 | Discharge: 2017-03-24 | Disposition: A | Discharge status: Home / Self Care | Payer: Managed Care, Other (non HMO) | Source: Ambulatory Visit | Attending: Internal Medicine | Admitting: Internal Medicine

## 2017-03-24 DIAGNOSIS — Z1231 Encounter for screening mammogram for malignant neoplasm of breast: Secondary | ICD-10-CM | POA: Diagnosis not present

## 2017-03-26 ENCOUNTER — Other Ambulatory Visit: Payer: Self-pay | Admitting: Internal Medicine

## 2017-03-26 DIAGNOSIS — R928 Other abnormal and inconclusive findings on diagnostic imaging of breast: Secondary | ICD-10-CM

## 2017-03-26 DIAGNOSIS — N6489 Other specified disorders of breast: Secondary | ICD-10-CM

## 2017-04-02 ENCOUNTER — Ambulatory Visit
Admission: RE | Admit: 2017-04-02 | Discharge: 2017-04-02 | Disposition: A | Discharge status: Home / Self Care | Payer: Managed Care, Other (non HMO) | Source: Ambulatory Visit | Attending: Internal Medicine | Admitting: Internal Medicine

## 2017-04-02 DIAGNOSIS — N6489 Other specified disorders of breast: Secondary | ICD-10-CM | POA: Diagnosis not present

## 2017-04-02 DIAGNOSIS — R928 Other abnormal and inconclusive findings on diagnostic imaging of breast: Secondary | ICD-10-CM

## 2018-04-21 ENCOUNTER — Other Ambulatory Visit: Payer: Self-pay | Admitting: Internal Medicine

## 2018-04-21 DIAGNOSIS — Z1231 Encounter for screening mammogram for malignant neoplasm of breast: Secondary | ICD-10-CM

## 2019-09-01 ENCOUNTER — Emergency Department: Payer: 59

## 2019-09-01 ENCOUNTER — Other Ambulatory Visit: Payer: Self-pay

## 2019-09-01 ENCOUNTER — Encounter: Payer: Self-pay | Admitting: Emergency Medicine

## 2019-09-01 ENCOUNTER — Emergency Department
Admission: EM | Admit: 2019-09-01 | Discharge: 2019-09-01 | Disposition: A | Discharge status: Home / Self Care | Payer: 59 | Attending: Emergency Medicine | Admitting: Emergency Medicine

## 2019-09-01 DIAGNOSIS — E039 Hypothyroidism, unspecified: Secondary | ICD-10-CM | POA: Diagnosis not present

## 2019-09-01 DIAGNOSIS — Z87891 Personal history of nicotine dependence: Secondary | ICD-10-CM | POA: Diagnosis not present

## 2019-09-01 DIAGNOSIS — R0789 Other chest pain: Secondary | ICD-10-CM | POA: Diagnosis present

## 2019-09-01 DIAGNOSIS — R079 Chest pain, unspecified: Secondary | ICD-10-CM | POA: Diagnosis not present

## 2019-09-01 LAB — BASIC METABOLIC PANEL
Anion gap: 11 (ref 5–15)
BUN: 13 mg/dL (ref 6–20)
CO2: 24 mmol/L (ref 22–32)
Calcium: 8.6 mg/dL — ABNORMAL LOW (ref 8.9–10.3)
Chloride: 104 mmol/L (ref 98–111)
Creatinine, Ser: 0.99 mg/dL (ref 0.44–1.00)
GFR calc Af Amer: >60 mL/min (ref >60)
GFR calc non Af Amer: >60 mL/min (ref >60)
Glucose, Bld: 99 mg/dL (ref 70–99)
Potassium: 3.9 mmol/L (ref 3.5–5.1)
Sodium: 139 mmol/L (ref 135–145)

## 2019-09-01 LAB — TROPONIN I (HIGH SENSITIVITY)
Troponin I (High Sensitivity): 4 ng/L (ref <18)
Troponin I (High Sensitivity): 4 ng/L (ref <18)

## 2019-09-01 LAB — CBC
HCT: 41 % (ref 36.0–46.0)
Hemoglobin: 13.9 g/dL (ref 12.0–15.0)
MCH: 29.3 pg (ref 26.0–34.0)
MCHC: 33.9 g/dL (ref 30.0–36.0)
MCV: 86.3 fL (ref 80.0–100.0)
Platelets: 219 10*3/uL (ref 150–400)
RBC: 4.75 MIL/uL (ref 3.87–5.11)
RDW: 12.2 % (ref 11.5–15.5)
WBC: 4.4 10*3/uL (ref 4.0–10.5)
nRBC: 0 % (ref 0.0–0.2)

## 2019-09-01 NOTE — ED Triage Notes (Signed)
Pt reports pressure like CP that is radiating into her left arm and shoulder since Sunday. Pt in from [facility]. Pt concerned due to a family hx of cardiac problems.

## 2019-09-01 NOTE — ED Provider Notes (Signed)
[facility] Emergency Department Provider Note       Time seen: ----------------------------------------- 10:11 AM on 09/01/2019 -----------------------------------------   I have reviewed the triage vital signs and the nursing notes.  HISTORY   Chief Complaint Chest Pain    HPI Abigail Lawson is a 48 y.o. female with a history of chronic headaches, GERD, hypothyroidism who presents to the ED for chest pressure that is radiating into her left arm and shoulder since Sunday.  Patient was here from [facility] from same.  She does have a history of cardiac problems.  She denies fevers, chills or other complaints.  Pain is 5 out of 10.  Past Medical History:  Diagnosis Date  . Chronic headaches   . 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 GERD (gastroesophageal reflux disease)   . 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 Hypothyroidism   . Thyroid disease     Patient Active Problem List   Diagnosis Date Noted  . Post-operative state 07/10/2015  . Postoperative state 07/09/2015  . Bleeding hemorrhoid 11/29/2014  . Chronic tension-type headache, intractable 11/29/2014  . Allergic rhinitis 11/29/2014  . Hypothyroidism, postop 11/29/2014    Past Surgical History:  Procedure Laterality Date  . ABDOMINAL HYSTERECTOMY    . BILATERAL SALPINGECTOMY  07/09/2015   Procedure: BILATERAL SALPINGECTOMY;  Surgeon: Suzy Bouchard, MD;  Location: [facility] ORS;  Service: Gynecology;;  . Rice Callas  2013  . OOPHORECTOMY Right 07/09/2015   Procedure: OOPHORECTOMY;  Surgeon: Suzy Bouchard, MD;  Location: [facility] ORS;  Service: Gynecology;  Laterality: Right;  . thyroidectomy     total  . TONSILLECTOMY  1997  . TUBAL LIGATION    . VAGINAL HYSTERECTOMY N/A 07/09/2015   Procedure: HYSTERECTOMY VAGINAL;  Surgeon: Suzy Bouchard, MD;  Location: [facility] ORS;  Service: Gynecology;   Laterality: N/A;    Allergies Patient has no known allergies.  Social History Social History   Tobacco Use  . Smoking status: Former Smoker    Quit date: 09/15/1993    Years since quitting: 25.9  Substance Use Topics  . Alcohol use: Yes    Comment: rarely  . Drug use: Yes   Review of Systems Constitutional: Negative for fever. Cardiovascular: Positive for chest pain Respiratory: Negative for shortness of breath. Gastrointestinal: Negative for abdominal pain, vomiting and diarrhea. Musculoskeletal: Negative for back pain. Skin: Negative for rash. Neurological: Negative for headaches, focal weakness or numbness.  All systems negative/normal/unremarkable except as stated in the HPI  ____________________________________________   PHYSICAL EXAM:  VITAL SIGNS: ED Triage Vitals  Enc Vitals Group     BP 09/01/19 0836 119/69     Pulse Rate 09/01/19 0836 80     Resp 09/01/19 0836 20     Temp 09/01/19 0836 98.5 F (36.9 C)     Temp Source 09/01/19 0836 Oral     SpO2 09/01/19 0836 97 %     Weight 09/01/19 0834 189 lb (85.7 kg)     Height 09/01/19 0834 5\' 5"  (1.651 m)     Head Circumference --      Peak Flow --      Pain Score 09/01/19 0834 5     Pain Loc --      Pain Edu? --      Excl. in GC? --    Constitutional: Alert and oriented. Well appearing and in no distress. Eyes: Conjunctivae  are normal. Normal extraocular movements. ENT      Head: Normocephalic and atraumatic.      Nose: No congestion/rhinnorhea.      Mouth/Throat: Mucous membranes are moist.      Neck: No stridor. Cardiovascular: Normal rate, regular rhythm. No murmurs, rubs, or gallops. Respiratory: Normal respiratory effort without tachypnea nor retractions. Breath sounds are clear and equal bilaterally. No wheezes/rales/rhonchi. Gastrointestinal: Soft and nontender. Normal bowel sounds Musculoskeletal: Nontender with normal range of motion in extremities. No lower extremity tenderness nor  edema. Neurologic:  Normal speech and language. No gross focal neurologic deficits are appreciated.  Skin:  Skin is warm, dry and intact. No rash noted. Psychiatric: Mood and affect are normal. Speech and behavior are normal.  ____________________________________________  EKG: Interpreted by me.  Sinus rhythm with rate of 71 bpm, normal PR interval, normal QRS, normal QT  ____________________________________________  ED COURSE:  As part of my medical decision making, I reviewed the following data within the [place] History obtained from family if available, nursing notes, old chart and ekg, as well as notes from prior ED visits. Patient presented for chest pain, we will assess with labs and imaging as indicated at this time.   Procedures  Abigail Lawson was evaluated in Emergency Department on 09/01/2019 for the symptoms described in the history of present illness. She was evaluated in the context of the global COVID-19 pandemic, which necessitated consideration that the patient might be at risk for infection with the SARS-CoV-2 virus that causes COVID-19. Institutional protocols and algorithms that pertain to the evaluation of patients at risk for COVID-19 are in a state of rapid change based on information released by regulatory bodies including the CDC and federal and state organizations. These policies and algorithms were followed during the patient's care in the ED.  ____________________________________________   LABS (pertinent positives/negatives)  Labs Reviewed  BASIC METABOLIC PANEL - Abnormal; Notable for the following components:      Result Value   Calcium 8.6 (*)    All other components within normal limits  CBC  TROPONIN I (HIGH SENSITIVITY)  TROPONIN I (HIGH SENSITIVITY)    RADIOLOGY  Chest x-ray is unremarkable  ____________________________________________   DIFFERENTIAL DIAGNOSIS   Musculoskeletal pain, GERD, anxiety, unstable angina,  MI, PE  FINAL ASSESSMENT AND PLAN  Chest pain   Plan: The patient had presented for nonspecific chest pain. Patient's labs were reassuring including repeat troponin testing. Patient's imaging was unremarkable.  She will be referred to cardiology for outpatient follow-up.  She is low risk for ACS.   Laurence Aly, MD    Note: This note was generated in part or whole with voice recognition software. Voice recognition is usually quite accurate but there are transcription errors that can and very often do occur. I apologize for any typographical errors that were not detected and corrected.     Earleen Newport, MD 09/01/19 1017

## 2019-09-01 NOTE — ED Notes (Signed)
Patient ambulatory to lobby with steady gait and NAD noted. Verbalized understanding of discharge instructions and follow-up care.  

## 2019-10-03 ENCOUNTER — Other Ambulatory Visit: Payer: Self-pay

## 2019-10-03 ENCOUNTER — Encounter: Payer: Self-pay | Admitting: Cardiology

## 2019-10-03 ENCOUNTER — Ambulatory Visit (INDEPENDENT_AMBULATORY_CARE_PROVIDER_SITE_OTHER): Payer: 59 | Admitting: Cardiology

## 2019-10-03 VITALS — BP 116/80 | HR 78 | Ht 64.0 in | Wt 198.8 lb

## 2019-10-03 DIAGNOSIS — R079 Chest pain, unspecified: Secondary | ICD-10-CM | POA: Diagnosis not present

## 2019-10-03 NOTE — Addendum Note (Signed)
Addended by: Eleonore Chiquito on: 10/03/2019 03:14 PM   Modules accepted: Orders

## 2019-10-03 NOTE — Patient Instructions (Signed)
Medication Instructions:  None medication changes *If you need a refill on your cardiac medications before your next appointment, please call your pharmacy*  Lab Work: You need to have a BMET done 1 week prior to your cardiac CT.  Your order has been placed in the computer. [facility]  Monday through Friday 7 am to 6 pm.  You do not need an appointment.  If you have labs (blood work) drawn today and your tests are completely normal, you will receive your results only by: Marland Kitchen MyChart Message (if you have MyChart) OR . A paper copy in the mail If you have any lab test that is abnormal or we need to change your treatment, we will call you to review the results.  Testing/Procedures:  Your physician has requested that you have an echocardiogram. Echocardiography is a painless test that uses sound waves to create images of your heart. It provides your doctor with information about the size and shape of your heart and how well your heart's chambers and valves are working. This procedure takes approximately one hour. There are no restrictions for this procedure.   Your cardiac CT will be scheduled at one of the below locations:   [facility] [address] [place], [place] [address] (336) [place] [address] [place], [place] [address] [phone]  If scheduled at [facility], please arrive at the [facility] main entrance of [facility] 30-45 minutes prior to test start time. Proceed to the [facility] Radiology Department (first floor) to check-in and test prep.  If scheduled at [facility], please arrive 15 mins early for check-in and test prep.  Please follow these instructions carefully (unless otherwise directed):    On the Night Before the Test: . Be sure to Drink plenty of water. . Do not consume any caffeinated/decaffeinated beverages  or chocolate 12 hours prior to your test. . Do not take any antihistamines 12 hours prior to your test. . If the patient has contrast allergy: ? Patient will need a prescription for Prednisone and very clear instructions (as follows): 1. Prednisone 50 mg - take 13 hours prior to test 2. Take another Prednisone 50 mg 7 hours prior to test 3. Take another Prednisone 50 mg 1 hour prior to test 4. Take Benadryl 50 mg 1 hour prior to test . Patient must complete all four doses of above prophylactic medications. . Patient will need a ride after test due to Benadryl.  On the Day of the Test: . Drink plenty of water. Do not drink any water within one hour of the test. . Do not eat any food 4 hours prior to the test. . You may take your regular medications prior to the test.  . FEMALES- please wear underwire-free bra if available        After the Test: . Drink plenty of water. . After receiving IV contrast, you may experience a mild flushed feeling. This is normal. . On occasion, you may experience a mild rash up to 24 hours after the test. This is not dangerous. If this occurs, you can take Benadryl 25 mg and increase your fluid intake. . If you experience trouble breathing, this can be serious. If it is severe call 911 IMMEDIATELY. If it is mild, please call our office. . If you take any of these medications: Glipizide/Metformin, Avandament, Glucavance, please do not  take 48 hours after completing test unless otherwise instructed.   Once we have confirmed authorization from your insurance company, we will call you to set up a date and time for your test.   For non-scheduling related questions, please contact the cardiac imaging nurse navigator should you have any questions/concerns: Rockwell Alexandria, RN Navigator Cardiac Imaging [facility] [phone] Office       Follow-Up: At [facility], you and your health needs are our priority.  As part of our  continuing mission to provide you with exceptional heart care, we have created designated Provider Care Teams.  These Care Teams include your primary Cardiologist (physician) and Advanced Practice Providers (APPs -  Physician Assistants and Nurse Practitioners) who all work together to provide you with the care you need, when you need it.  Your next appointment:   2 month(s)  The format for your next appointment:   In Person  Provider:    You may see Debbe Odea, MD or one of the following Advanced Practice Providers on your designated Care Team:    Nicolasa Ducking, NP  Eula Listen, PA-C  Marisue Ivan, PA-C   Other Instructions  Cardiac CT Angiogram A cardiac CT angiogram is a procedure to look at the heart and the area around the heart. It may be done to help find the cause of chest pains or other symptoms of heart disease. During this procedure, a substance called contrast dye is injected into the blood vessels in the area to be checked. A large X-ray machine, called a CT scanner, then takes detailed pictures of the heart and the surrounding area. The procedure is also sometimes called a coronary CT angiogram, coronary artery scanning, or CTA. A cardiac CT angiogram allows the health care provider to see how well blood is flowing to and from the heart. The health care provider will be able to see if there are any problems, such as:  Blockage or narrowing of the coronary arteries in the heart.  Fluid around the heart.  Signs of weakness or disease in the muscles, valves, and tissues of the heart. Tell a health care provider about:  Any allergies you have. This is especially important if you have had a previous allergic reaction to contrast dye.  All medicines you are taking, including vitamins, herbs, eye drops, creams, and over-the-counter medicines.  Any blood disorders you have.  Any surgeries you have had.  Any medical conditions you have.  Whether you are  pregnant or may be pregnant.  Any anxiety disorders, chronic pain, or other conditions you have that may increase your stress or prevent you from lying still. What are the risks? Generally, this is a safe procedure. However, problems may occur, including:  Bleeding.  Infection.  Allergic reactions to medicines or dyes.  Damage to other structures or organs.  Kidney damage from the contrast dye that is used.  Increased risk of cancer from radiation exposure. This risk is low. Talk with your health care provider about: ? The risks and benefits of testing. ? How you can receive the lowest dose of radiation. What happens before the procedure?  Wear comfortable clothing and remove any jewelry, glasses, dentures, and hearing aids.  Follow instructions from your health care provider about eating and drinking. This may include: ? For 12 hours before the procedure -- avoid caffeine. This includes tea, coffee, soda, energy drinks, and diet pills. Drink plenty of water or other fluids that do not have  caffeine in them. Being well hydrated can prevent complications. ? For 4-6 hours before the procedure -- stop eating and drinking. The contrast dye can cause nausea, but this is less likely if your stomach is empty.  Ask your health care provider about changing or stopping your regular medicines. This is especially important if you are taking diabetes medicines, blood thinners, or medicines to treat problems with erections (erectile dysfunction). What happens during the procedure?   Hair on your chest may need to be removed so that small sticky patches called electrodes can be placed on your chest. These will transmit information that helps to monitor your heart during the procedure.  An IV will be inserted into one of your veins.  You might be given a medicine to control your heart rate during the procedure. This will help to ensure that good images are obtained.  You will be asked to lie on  an exam table. This table will slide in and out of the CT machine during the procedure.  Contrast dye will be injected into the IV. You might feel warm, or you may get a metallic taste in your mouth.  You will be given a medicine called nitroglycerin. This will relax or dilate the arteries in your heart.  The table that you are lying on will move into the CT machine tunnel for the scan.  The person running the machine will give you instructions while the scans are being done. You may be asked to: ? Keep your arms above your head. ? Hold your breath. ? Stay very still, even if the table is moving.  When the scanning is complete, you will be moved out of the machine.  The IV will be removed. The procedure may vary among health care providers and hospitals. What can I expect after the procedure? After your procedure, it is common to have:  A metallic taste in your mouth from the contrast dye.  A feeling of warmth.  A headache from the nitroglycerin. Follow these instructions at home:  Take over-the-counter and prescription medicines only as told by your health care provider.  If you are told, drink enough fluid to keep your urine pale yellow. This will help to flush the contrast dye out of your body.  Most people can return to their normal activities right after the procedure. Ask your health care provider what activities are safe for you.  It is up to you to get the results of your procedure. Ask your health care provider, or the department that is doing the procedure, when your results will be ready.  Keep all follow-up visits as told by your health care provider. This is important. Contact a health care provider if:  You have any symptoms of allergy to the contrast dye. These include: ? Shortness of breath. ? Rash or hives. ? A racing heartbeat. Summary  A cardiac CT angiogram is a procedure to look at the heart and the area around the heart. It may be done to help find  the cause of chest pains or other symptoms of heart disease.  During this procedure, a large X-ray machine, called a CT scanner, takes detailed pictures of the heart and the surrounding area after a contrast dye has been injected into blood vessels in the area.  Ask your health care provider about changing or stopping your regular medicines before the procedure. This is especially important if you are taking diabetes medicines, blood thinners, or medicines to treat erectile dysfunction.  If you are told, drink enough fluid to keep your urine pale yellow. This will help to flush the contrast dye out of your body. This information is not intended to replace advice given to you by your health care provider. Make sure you discuss any questions you have with your health care provider. Document Revised: 04/27/2019 Document Reviewed: 04/27/2019 Elsevier Patient Education  2020 [employer].    Echocardiogram An echocardiogram is a procedure that uses painless sound waves (ultrasound) to produce an image of the heart. Images from an echocardiogram can provide important information about:  Signs of coronary artery disease (CAD).  Aneurysm detection. An aneurysm is a weak or damaged part of an artery wall that bulges out from the normal force of blood pumping through the body.  Heart size and shape. Changes in the size or shape of the heart can be associated with certain conditions, including heart failure, aneurysm, and CAD.  Heart muscle function.  Heart valve function.  Signs of a past heart attack.  Fluid buildup around the heart.  Thickening of the heart muscle.  A tumor or infectious growth around the heart valves. Tell a health care provider about:  Any allergies you have.  All medicines you are taking, including vitamins, herbs, eye drops, creams, and over-the-counter medicines.  Any blood disorders you have.  Any surgeries you have had.  Any medical conditions you  have.  Whether you are pregnant or may be pregnant. What are the risks? Generally, this is a safe procedure. However, problems may occur, including:  Allergic reaction to dye (contrast) that may be used during the procedure. What happens before the procedure? No specific preparation is needed. You may eat and drink normally. What happens during the procedure?   An IV tube may be inserted into one of your veins.  You may receive contrast through this tube. A contrast is an injection that improves the quality of the pictures from your heart.  A gel will be applied to your chest.  A wand-like tool (transducer) will be moved over your chest. The gel will help to transmit the sound waves from the transducer.  The sound waves will harmlessly bounce off of your heart to allow the heart images to be captured in real-time motion. The images will be recorded on a computer. The procedure may vary among health care providers and hospitals. What happens after the procedure?  You may return to your normal, everyday life, including diet, activities, and medicines, unless your health care provider tells you not to do that. Summary  An echocardiogram is a procedure that uses painless sound waves (ultrasound) to produce an image of the heart.  Images from an echocardiogram can provide important information about the size and shape of your heart, heart muscle function, heart valve function, and fluid buildup around your heart.  You do not need to do anything to prepare before this procedure. You may eat and drink normally.  After the echocardiogram is completed, you may return to your normal, everyday life, unless your health care provider tells you not to do that. This information is not intended to replace advice given to you by your health care provider. Make sure you discuss any questions you have with your health care provider. Document Revised: 12/23/2018 Document Reviewed: 10/04/2016 Elsevier  Patient Education  2020 [employer].

## 2019-10-03 NOTE — Progress Notes (Signed)
Cardiology Office Note:    Date:  10/03/2019   ID:  Abigail Lawson, DOB 12/18/1970, MRN [id number]  [facility]  Cardiologist:  Debbe OdeaBrian Agbor-Etang, MD  Electrophysiologist:  None   Referring MD: Emily FilbertWilliams, Jonathan E, MD   Chief Complaint  Patient presents with  . New Patient (Initial Visit)    Ref by Dr. Mayford KnifeWilliams for chest pain. Meds reviewed by the pt. verbally. Pt. c/o chest pain/pressure that comes and goes.    Abigail Lawson is a 49 y.o. female who is being seen today for the evaluation of chest pain at the request of Emily FilbertWilliams, Jonathan E, MD.  History of Present Illness:    Abigail Lawson is a 49 y.o. female with a hx of GERD, hypothyroidism who presents with chest pain.  Patient states symptoms of chest pains have been ongoing for about 5 weeks now.  She describes chest discomfort as pressure-like, rates it as a 5 out of 10 when it occurs, usually lasting 15 to 20 minutes.  Symptoms are not related to exertion and occur roughly 3 times a week.  She was seen in the ED a month ago due to chest pain.  EKG was normal, high-sensitivity troponins were within normal limits, chest x-ray showed no acute process.  Patient was discharged with recommendation for outpatient follow-up.  She denies palpitations, presyncope or syncope.  She states having a strong family history of heart disease with her brother having MIs in his 2240s.  He also has a pacemaker and an LVAD.  Past Medical History:  Diagnosis Date  . Chronic headaches   . 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 GERD (gastroesophageal reflux disease)   . 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 Hypothyroidism   . Thyroid disease     Past Surgical History:  Procedure Laterality Date  . ABDOMINAL HYSTERECTOMY    . BILATERAL SALPINGECTOMY  07/09/2015   Procedure: BILATERAL SALPINGECTOMY;  Surgeon: Suzy Bouchardhomas J Schermerhorn, MD;  Location: [facility] ORS;   Service: Gynecology;;  . [place] CallasHOLECYSTECTOMY  2013  . OOPHORECTOMY Right 07/09/2015   Procedure: OOPHORECTOMY;  Surgeon: Suzy Bouchardhomas J Schermerhorn, MD;  Location: [facility] ORS;  Service: Gynecology;  Laterality: Right;  . thyroidectomy     total  . TONSILLECTOMY  1997  . TUBAL LIGATION    . VAGINAL HYSTERECTOMY N/A 07/09/2015   Procedure: HYSTERECTOMY VAGINAL;  Surgeon: Suzy Bouchardhomas J Schermerhorn, MD;  Location: [facility] ORS;  Service: Gynecology;  Laterality: N/A;    Current Medications: Current Meds  Medication Sig  . aspirin 81 MG EC tablet Take 81 mg by mouth daily.   . calcitRIOL (ROCALTROL) 0.25 MCG capsule Take by mouth.  . calcium carbonate (OS-CAL - DOSED IN MG OF ELEMENTAL CALCIUM) 1250 (500 CA) MG tablet Take 6 tablets by mouth daily.   Marland Kitchen. esomeprazole (NEXIUM) 20 MG capsule Take 20 mg by mouth daily at 12 noon.   Marland Kitchen. levothyroxine (SYNTHROID, LEVOTHROID) 100 MCG tablet Take 100 mcg by mouth daily before breakfast.   . Multiple Vitamin (MULTIVITAMIN) capsule Take 1 capsule by mouth daily.     Allergies:   Oxycodone   Social History   Socioeconomic History  . Marital status: Married    Spouse name: Not on file  . Number of children: Not on file  . Years of education: Not on file  . Highest education level: Not on file  Occupational History  .  Not on file  Tobacco Use  . Smoking status: Former Smoker    Packs/day: 0.25    Years: 6.00    Pack years: 1.50    Types: Cigarettes    Quit date: 09/15/1993    Years since quitting: 26.0  . Smokeless tobacco: Never Used  Substance and Sexual Activity  . Alcohol use: Yes    Comment: rarely  . Drug use: Never  . Sexual activity: Yes    Birth control/protection: Surgical  Other Topics Concern  . Not on file  Social History Narrative  . Not on file   Social Determinants of Health   Financial Resource Strain:   . Difficulty of Paying Living Expenses: Not on file  Food Insecurity:   . Worried About Programme researcher, broadcasting/film/video in the Last Year: Not  on file  . Ran Out of Food in the Last Year: Not on file  Transportation Needs:   . Lack of Transportation (Medical): Not on file  . Lack of Transportation (Non-Medical): Not on file  Physical Activity:   . Days of Exercise per Week: Not on file  . Minutes of Exercise per Session: Not on file  Stress:   . Feeling of Stress : Not on file  Social Connections:   . Frequency of Communication with Friends and Family: Not on file  . Frequency of Social Gatherings with Friends and Family: Not on file  . Attends Religious Services: Not on file  . Active Member of Clubs or Organizations: Not on file  . Attends Banker Meetings: Not on file  . Marital Status: Not on file     Family History: The patient's family history includes Cancer in her father. There is no history of Breast cancer, Ovarian cancer, Colon cancer, or Diabetes.  MI in brother, pacemaker in brother, LVAD in brother.  ROS:   Please see the history of present illness.     All other systems reviewed and are negative.  EKGs/Labs/Other Studies Reviewed:    The following studies were reviewed today:   EKG:  EKG is  ordered today.  The ekg ordered today demonstrates normal sinus rhythm, normal ECG.  Recent Labs: 09/01/2019: BUN 13; Creatinine, Ser 0.99; Hemoglobin 13.9; Platelets 219; Potassium 3.9; Sodium 139  Recent Lipid Panel No results found for: CHOL, TRIG, HDL, CHOLHDL, VLDL, LDLCALC, LDLDIRECT  Physical Exam:    VS:  BP 116/80 (BP Location: Right Arm, Patient Position: Sitting, Cuff Size: Normal)   Pulse 78   Ht 5\' 4"  (1.626 m)   Wt 198 lb 12 oz (90.2 kg)   LMP 06/25/2015   SpO2 99%   BMI 34.12 kg/m     Wt Readings from Last 3 Encounters:  10/03/19 198 lb 12 oz (90.2 kg)  09/01/19 189 lb (85.7 kg)  07/09/15 183 lb (83 kg)     GEN:  Well nourished, well developed in no acute distress HEENT: Normal NECK: No JVD; No carotid bruits LYMPHATICS: No lymphadenopathy CARDIAC: RRR, no murmurs,  rubs, gallops RESPIRATORY:  Clear to auscultation without rales, wheezing or rhonchi  ABDOMEN: Soft, non-tender, non-distended MUSCULOSKELETAL:  No edema; No deformity  SKIN: Warm and dry NEUROLOGIC:  Alert and oriented x 3 PSYCHIATRIC:  Normal affect   ASSESSMENT:    1. Chest pain, unspecified type   2. Chest pain of uncertain etiology    PLAN:    In order of problems listed above:  1. Patient with atypical chest pain.  Main risk factors is  early family history of CAD.  Atypical chest pain is common in women.  Her family history is also concerning.  We will get echocardiogram, get coronary CT angiogram to evaluate for coronary artery disease.  Follow-up in 2 months after echo and coronary CTA.  Total encounter time more than 60 minutes  Greater than 50% was spent in counseling and coordination of care with the patient.  Including explaining types of chest pain, reasons for testing, coronary CT angiogram, echocardiogram.   This note was generated in part or whole with voice recognition software. Voice recognition is usually quite accurate but there are transcription errors that can and very often do occur. I apologize for any typographical errors that were not detected and corrected.   Medication Adjustments/Labs and Tests Ordered: Current medicines are reviewed at length with the patient today.  Concerns regarding medicines are outlined above.  Orders Placed This Encounter  Procedures  . CT CORONARY MORPH W/CTA COR W/SCORE W/CA W/CM &/OR WO/CM  . CT CORONARY FRACTIONAL FLOW RESERVE DATA PREP  . CT CORONARY FRACTIONAL FLOW RESERVE FLUID ANALYSIS  . Basic Metabolic Panel (BMET)  . EKG 12-Lead   No orders of the defined types were placed in this encounter.   Patient Instructions  Medication Instructions:  None medication changes *If you need a refill on your cardiac medications before your next appointment, please call your pharmacy*  Lab Work: You need to have a BMET done  1 week prior to your cardiac CT.  Your order has been placed in the computer. You need to [facility]  Monday through Friday 7 am to 6 pm.  You do not need an appointment.  If you have labs (blood work) drawn today and your tests are completely normal, you will receive your results only by: Marland Kitchen MyChart Message (if you have MyChart) OR . A paper copy in the mail If you have any lab test that is abnormal or we need to change your treatment, we will call you to review the results.  Testing/Procedures:  Your physician has requested that you have an echocardiogram. Echocardiography is a painless test that uses sound waves to create images of your heart. It provides your doctor with information about the size and shape of your heart and how well your heart's chambers and valves are working. This procedure takes approximately one hour. There are no restrictions for this procedure.   Your cardiac CT will be scheduled at one of the below locations:   [facility] [address] [place], [place] [address] [phone]  OR  [facility] [address] [place], [place] [address] [phone]  If scheduled at [facility], please arrive at the [facility] main entrance of [facility] 30-45 minutes prior to test start time. Proceed to the [facility] Radiology Department (first floor) to check-in and test prep.  If scheduled at [facility], please arrive 15 mins early for check-in and test prep.  Please follow these instructions carefully (unless otherwise directed):    On the Night Before the Test: . Be sure to Drink plenty of water. . Do not consume any caffeinated/decaffeinated beverages or chocolate 12 hours prior to your test. . Do not take any antihistamines 12 hours prior to your test. . If the patient has contrast allergy: ? Patient will need a prescription for Prednisone and  very clear instructions (as follows): 1. Prednisone 50 mg - take 13  hours prior to test 2. Take another Prednisone 50 mg 7 hours prior to test 3. Take another Prednisone 50 mg 1 hour prior to test 4. Take Benadryl 50 mg 1 hour prior to test . Patient must complete all four doses of above prophylactic medications. . Patient will need a ride after test due to Benadryl.  On the Day of the Test: . Drink plenty of water. Do not drink any water within one hour of the test. . Do not eat any food 4 hours prior to the test. . You may take your regular medications prior to the test.  . FEMALES- please wear underwire-free bra if available        After the Test: . Drink plenty of water. . After receiving IV contrast, you may experience a mild flushed feeling. This is normal. . On occasion, you may experience a mild rash up to 24 hours after the test. This is not dangerous. If this occurs, you can take Benadryl 25 mg and increase your fluid intake. . If you experience trouble breathing, this can be serious. If it is severe call 911 IMMEDIATELY. If it is mild, please call our office. . If you take any of these medications: Glipizide/Metformin, Avandament, Glucavance, please do not take 48 hours after completing test unless otherwise instructed.   Once we have confirmed authorization from your insurance company, we will call you to set up a date and time for your test.   For non-scheduling related questions, please contact the cardiac imaging nurse navigator should you have any questions/concerns: Rockwell Alexandria, RN Navigator Cardiac Imaging [facility] [phone] Office       Follow-Up: At [facility], you and your health needs are our priority.  As part of our continuing mission to provide you with exceptional heart care, we have created designated Provider Care Teams.  These Care Teams include your primary Cardiologist (physician) and Advanced Practice Providers  (APPs -  Physician Assistants and Nurse Practitioners) who all work together to provide you with the care you need, when you need it.  Your next appointment:   2 month(s)  The format for your next appointment:   In Person  Provider:    You may see Debbe Odea, MD or one of the following Advanced Practice Providers on your designated Care Team:    Nicolasa Ducking, NP  Eula Listen, PA-C  Marisue Ivan, PA-C   Other Instructions  Cardiac CT Angiogram A cardiac CT angiogram is a procedure to look at the heart and the area around the heart. It may be done to help find the cause of chest pains or other symptoms of heart disease. During this procedure, a substance called contrast dye is injected into the blood vessels in the area to be checked. A large X-ray machine, called a CT scanner, then takes detailed pictures of the heart and the surrounding area. The procedure is also sometimes called a coronary CT angiogram, coronary artery scanning, or CTA. A cardiac CT angiogram allows the health care provider to see how well blood is flowing to and from the heart. The health care provider will be able to see if there are any problems, such as:  Blockage or narrowing of the coronary arteries in the heart.  Fluid around the heart.  Signs of weakness or disease in the muscles, valves, and tissues of the heart. Tell a health care provider about:  Any allergies you have. This is especially important if you have  had a previous allergic reaction to contrast dye.  All medicines you are taking, including vitamins, herbs, eye drops, creams, and over-the-counter medicines.  Any blood disorders you have.  Any surgeries you have had.  Any medical conditions you have.  Whether you are pregnant or may be pregnant.  Any anxiety disorders, chronic pain, or other conditions you have that may increase your stress or prevent you from lying still. What are the risks? Generally, this is a safe  procedure. However, problems may occur, including:  Bleeding.  Infection.  Allergic reactions to medicines or dyes.  Damage to other structures or organs.  Kidney damage from the contrast dye that is used.  Increased risk of cancer from radiation exposure. This risk is low. Talk with your health care provider about: ? The risks and benefits of testing. ? How you can receive the lowest dose of radiation. What happens before the procedure?  Wear comfortable clothing and remove any jewelry, glasses, dentures, and hearing aids.  Follow instructions from your health care provider about eating and drinking. This may include: ? For 12 hours before the procedure -- avoid caffeine. This includes tea, coffee, soda, energy drinks, and diet pills. Drink plenty of water or other fluids that do not have caffeine in them. Being well hydrated can prevent complications. ? For 4-6 hours before the procedure -- stop eating and drinking. The contrast dye can cause nausea, but this is less likely if your stomach is empty.  Ask your health care provider about changing or stopping your regular medicines. This is especially important if you are taking diabetes medicines, blood thinners, or medicines to treat problems with erections (erectile dysfunction). What happens during the procedure?   Hair on your chest may need to be removed so that small sticky patches called electrodes can be placed on your chest. These will transmit information that helps to monitor your heart during the procedure.  An IV will be inserted into one of your veins.  You might be given a medicine to control your heart rate during the procedure. This will help to ensure that good images are obtained.  You will be asked to lie on an exam table. This table will slide in and out of the CT machine during the procedure.  Contrast dye will be injected into the IV. You might feel warm, or you may get a metallic taste in your mouth.  You  will be given a medicine called nitroglycerin. This will relax or dilate the arteries in your heart.  The table that you are lying on will move into the CT machine tunnel for the scan.  The person running the machine will give you instructions while the scans are being done. You may be asked to: ? Keep your arms above your head. ? Hold your breath. ? Stay very still, even if the table is moving.  When the scanning is complete, you will be moved out of the machine.  The IV will be removed. The procedure may vary among health care providers and hospitals. What can I expect after the procedure? After your procedure, it is common to have:  A metallic taste in your mouth from the contrast dye.  A feeling of warmth.  A headache from the nitroglycerin. Follow these instructions at home:  Take over-the-counter and prescription medicines only as told by your health care provider.  If you are told, drink enough fluid to keep your urine pale yellow. This will help to flush the contrast  dye out of your body.  Most people can return to their normal activities right after the procedure. Ask your health care provider what activities are safe for you.  It is up to you to get the results of your procedure. Ask your health care provider, or the department that is doing the procedure, when your results will be ready.  Keep all follow-up visits as told by your health care provider. This is important. Contact a health care provider if:  You have any symptoms of allergy to the contrast dye. These include: ? Shortness of breath. ? Rash or hives. ? A racing heartbeat. Summary  A cardiac CT angiogram is a procedure to look at the heart and the area around the heart. It may be done to help find the cause of chest pains or other symptoms of heart disease.  During this procedure, a large X-ray machine, called a CT scanner, takes detailed pictures of the heart and the surrounding area after a contrast  dye has been injected into blood vessels in the area.  Ask your health care provider about changing or stopping your regular medicines before the procedure. This is especially important if you are taking diabetes medicines, blood thinners, or medicines to treat erectile dysfunction.  If you are told, drink enough fluid to keep your urine pale yellow. This will help to flush the contrast dye out of your body. This information is not intended to replace advice given to you by your health care provider. Make sure you discuss any questions you have with your health care provider. Document Revised: 04/27/2019 Document Reviewed: 04/27/2019 Elsevier Patient Education  2020 [employer]    Echocardiogram An echocardiogram is a procedure that uses painless sound waves (ultrasound) to produce an image of the heart. Images from an echocardiogram can provide important information about:  Signs of coronary artery disease (CAD).  Aneurysm detection. An aneurysm is a weak or damaged part of an artery wall that bulges out from the normal force of blood pumping through the body.  Heart size and shape. Changes in the size or shape of the heart can be associated with certain conditions, including heart failure, aneurysm, and CAD.  Heart muscle function.  Heart valve function.  Signs of a past heart attack.  Fluid buildup around the heart.  Thickening of the heart muscle.  A tumor or infectious growth around the heart valves. Tell a health care provider about:  Any allergies you have.  All medicines you are taking, including vitamins, herbs, eye drops, creams, and over-the-counter medicines.  Any blood disorders you have.  Any surgeries you have had.  Any medical conditions you have.  Whether you are pregnant or may be pregnant. What are the risks? Generally, this is a safe procedure. However, problems may occur, including:  Allergic reaction to dye (contrast) that may be used during the  procedure. What happens before the procedure? No specific preparation is needed. You may eat and drink normally. What happens during the procedure?   An IV tube may be inserted into one of your veins.  You may receive contrast through this tube. A contrast is an injection that improves the quality of the pictures from your heart.  A gel will be applied to your chest.  A wand-like tool (transducer) will be moved over your chest. The gel will help to transmit the sound waves from the transducer.  The sound waves will harmlessly bounce off of your heart to allow the heart images to  be captured in real-time motion. The images will be recorded on a computer. The procedure may vary among health care providers and hospitals. What happens after the procedure?  You may return to your normal, everyday life, including diet, activities, and medicines, unless your health care provider tells you not to do that. Summary  An echocardiogram is a procedure that uses painless sound waves (ultrasound) to produce an image of the heart.  Images from an echocardiogram can provide important information about the size and shape of your heart, heart muscle function, heart valve function, and fluid buildup around your heart.  You do not need to do anything to prepare before this procedure. You may eat and drink normally.  After the echocardiogram is completed, you may return to your normal, everyday life, unless your health care provider tells you not to do that. This information is not intended to replace advice given to you by your health care provider. Make sure you discuss any questions you have with your health care provider. Document Revised: 12/23/2018 Document Reviewed: 10/04/2016 Elsevier Patient Education  2020 [employer].       Signed, Debbe Odea, MD  10/03/2019 3:04 PM    [facility]

## 2019-10-21 ENCOUNTER — Other Ambulatory Visit: Payer: 59

## 2019-12-02 ENCOUNTER — Ambulatory Visit: Payer: 59 | Admitting: Cardiology

## 2020-01-09 ENCOUNTER — Other Ambulatory Visit: Payer: Self-pay | Admitting: Infectious Diseases

## 2020-01-09 DIAGNOSIS — Z1231 Encounter for screening mammogram for malignant neoplasm of breast: Secondary | ICD-10-CM

## 2020-05-23 ENCOUNTER — Ambulatory Visit
Admission: RE | Admit: 2020-05-23 | Discharge: 2020-05-23 | Disposition: A | Discharge status: Home / Self Care | Payer: 59 | Source: Ambulatory Visit | Attending: Infectious Diseases | Admitting: Infectious Diseases

## 2020-05-23 DIAGNOSIS — Z1231 Encounter for screening mammogram for malignant neoplasm of breast: Secondary | ICD-10-CM | POA: Diagnosis not present

## 2020-09-20 ENCOUNTER — Other Ambulatory Visit
Admission: RE | Admit: 2020-09-20 | Discharge: 2020-09-20 | Disposition: A | Discharge status: Home / Self Care | Payer: 59 | Source: Ambulatory Visit | Attending: Infectious Diseases | Admitting: Infectious Diseases

## 2020-09-20 DIAGNOSIS — R079 Chest pain, unspecified: Secondary | ICD-10-CM | POA: Insufficient documentation

## 2020-09-20 LAB — TROPONIN I (HIGH SENSITIVITY): Troponin I (High Sensitivity): 6 ng/L (ref <18)

## 2021-04-28 ENCOUNTER — Emergency Department
Admission: EM | Admit: 2021-04-28 | Discharge: 2021-04-28 | Disposition: A | Discharge status: Home / Self Care | Payer: 59 | Attending: Emergency Medicine | Admitting: Emergency Medicine

## 2021-04-28 ENCOUNTER — Other Ambulatory Visit: Payer: Self-pay

## 2021-04-28 DIAGNOSIS — K219 Gastro-esophageal reflux disease without esophagitis: Secondary | ICD-10-CM | POA: Diagnosis not present

## 2021-04-28 DIAGNOSIS — E039 Hypothyroidism, unspecified: Secondary | ICD-10-CM | POA: Diagnosis not present

## 2021-04-28 DIAGNOSIS — R1013 Epigastric pain: Secondary | ICD-10-CM | POA: Insufficient documentation

## 2021-04-28 DIAGNOSIS — Z7982 Long term (current) use of aspirin: Secondary | ICD-10-CM | POA: Insufficient documentation

## 2021-04-28 DIAGNOSIS — N183 Chronic kidney disease, stage 3 unspecified: Secondary | ICD-10-CM | POA: Diagnosis not present

## 2021-04-28 DIAGNOSIS — Z87891 Personal history of nicotine dependence: Secondary | ICD-10-CM | POA: Insufficient documentation

## 2021-04-28 DIAGNOSIS — Z79899 Other long term (current) drug therapy: Secondary | ICD-10-CM | POA: Insufficient documentation

## 2021-04-28 DIAGNOSIS — R110 Nausea: Secondary | ICD-10-CM | POA: Diagnosis not present

## 2021-04-28 DIAGNOSIS — R1033 Periumbilical pain: Secondary | ICD-10-CM | POA: Insufficient documentation

## 2021-04-28 LAB — CBC
HCT: 38.9 % (ref 36.0–46.0)
Hemoglobin: 13.5 g/dL (ref 12.0–15.0)
MCH: 30.6 pg (ref 26.0–34.0)
MCHC: 34.7 g/dL (ref 30.0–36.0)
MCV: 88.2 fL (ref 80.0–100.0)
Platelets: 227 10*3/uL (ref 150–400)
RBC: 4.41 MIL/uL (ref 3.87–5.11)
RDW: 12.9 % (ref 11.5–15.5)
WBC: 8.4 10*3/uL (ref 4.0–10.5)
nRBC: 0 % (ref 0.0–0.2)

## 2021-04-28 LAB — COMPREHENSIVE METABOLIC PANEL
ALT: 28 U/L (ref 0–44)
AST: 29 U/L (ref 15–41)
Albumin: 4.1 g/dL (ref 3.5–5.0)
Alkaline Phosphatase: 62 U/L (ref 38–126)
Anion gap: 11 (ref 5–15)
BUN: 14 mg/dL (ref 6–20)
CO2: 22 mmol/L (ref 22–32)
Calcium: 7.5 mg/dL — ABNORMAL LOW (ref 8.9–10.3)
Chloride: 107 mmol/L (ref 98–111)
Creatinine, Ser: 0.88 mg/dL (ref 0.44–1.00)
GFR, Estimated: >60 mL/min (ref >60)
Glucose, Bld: 124 mg/dL — ABNORMAL HIGH (ref 70–99)
Potassium: 3.4 mmol/L — ABNORMAL LOW (ref 3.5–5.1)
Sodium: 140 mmol/L (ref 135–145)
Total Bilirubin: 0.7 mg/dL (ref 0.3–1.2)
Total Protein: 7.5 g/dL (ref 6.5–8.1)

## 2021-04-28 LAB — URINALYSIS, COMPLETE (UACMP) WITH MICROSCOPIC
Bacteria, UA: NONE SEEN
Bilirubin Urine: NEGATIVE
Glucose, UA: NEGATIVE mg/dL
Hgb urine dipstick: NEGATIVE
Ketones, ur: NEGATIVE mg/dL
Leukocytes,Ua: NEGATIVE
Nitrite: NEGATIVE
Protein, ur: NEGATIVE mg/dL
Specific Gravity, Urine: 1.018 (ref 1.005–1.030)
pH: 8 (ref 5.0–8.0)

## 2021-04-28 LAB — ETHANOL: Alcohol, Ethyl (B): <10 mg/dL (ref <10)

## 2021-04-28 LAB — LIPASE, BLOOD: Lipase: 47 U/L (ref 11–51)

## 2021-04-28 MED ORDER — ONDANSETRON HCL 4 MG/2ML IJ SOLN
4.0000 mg | Freq: Once | INTRAMUSCULAR | Status: AC
  Administered 2021-04-28: 4 mg via INTRAVENOUS
  Filled 2021-04-28: qty 2

## 2021-04-28 MED ORDER — LACTATED RINGERS IV BOLUS
1000.0000 mL | Freq: Once | INTRAVENOUS | Status: AC
  Administered 2021-04-28: 1000 mL via INTRAVENOUS

## 2021-04-28 MED ORDER — PANTOPRAZOLE SODIUM 40 MG IV SOLR
40.0000 mg | Freq: Once | INTRAVENOUS | Status: AC
  Administered 2021-04-28: 40 mg via INTRAVENOUS
  Filled 2021-04-28: qty 40

## 2021-04-28 MED ORDER — FAMOTIDINE IN NACL 20-0.9 MG/50ML-% IV SOLN
20.0000 mg | Freq: Once | INTRAVENOUS | Status: AC
  Administered 2021-04-28: 20 mg via INTRAVENOUS
  Filled 2021-04-28: qty 50

## 2021-04-28 MED ORDER — KETOROLAC TROMETHAMINE 30 MG/ML IJ SOLN
15.0000 mg | Freq: Once | INTRAMUSCULAR | Status: AC
  Administered 2021-04-28: 15 mg via INTRAVENOUS
  Filled 2021-04-28: qty 1

## 2021-04-28 NOTE — Discharge Instructions (Addendum)
Use Tylenol for pain and fevers.  Up to 1000 mg per dose, up to 4 times per day.  Do not take more than 4000 mg of Tylenol/acetaminophen within 24 hours..  Use naproxen/Aleve for anti-inflammatory pain relief. Use up to 500mg every 12 hours. Do not take more frequently than this. Do not use other NSAIDs (ibuprofen, Advil) while taking this medication. It is safe to take Tylenol with this.   

## 2021-04-28 NOTE — ED Provider Notes (Signed)
[facility] Emergency Department Provider Note ____________________________________________   Event Date/Time   First MD Initiated Contact with Patient 04/28/21 [phone]     (approximate)  I have reviewed the triage vital signs and the nursing notes.  HISTORY  Chief Complaint Abdominal Pain   HPI Abigail Lawson is a 50 y.o. femalewho presents to the ED for evaluation of abd pain and nausea.  Chart review indicates CKD 3, hypothyroidism and GERD.  Patient presents to the ED, accompanied by her husband, for evaluation of abdominal pain and nausea that started early this morning.  They report that patient attended a bachelorette party for one of her children last night and drank relative large amount of alcohol.  She reportedly "never drinks."  She and husband both deny any acute events last night.  Husband reports that she seemed okay when he saw her last in the evening yesterday.  But earlier this morning patient reports developing severe abdominal pain and nausea.  She reports pain to her periumbilical and epigastric abdomen.  She reports it comes in spasms or waves.  She reports up to 10/10 intensity.  She denies any recreational drugs beyond ethanol.  She denies any trauma, acute events, syncopal episodes.   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   Hypothyroidism    Thyroid disease     Patient Active Problem List   Diagnosis Date Noted   CKD (chronic kidney disease) stage 3, GFR 30-59 ml/min (HCC) 11/14/2015   Iron deficiency anemia due to chronic blood loss 11/14/2015   Post-operative state 07/10/2015   Postoperative state 07/09/2015   Bleeding hemorrhoids 11/29/2014   Episodic tension-type headache, not intractable 11/29/2014   Other allergic  rhinitis 11/29/2014   Postoperative hypothyroidism 11/29/2014    Past Surgical History:  Procedure Laterality Date   ABDOMINAL HYSTERECTOMY     BILATERAL SALPINGECTOMY  07/09/2015   Procedure: BILATERAL SALPINGECTOMY;  Surgeon: Suzy Bouchard, MD;  Location: [facility];  Service: Gynecology;;   CHOLECYSTECTOMY  2013   OOPHORECTOMY Right 07/09/2015   Procedure: Alma Friendly;  Surgeon: Suzy Bouchard, MD;  Location: [facility];  Service: Gynecology;  Laterality: Right;   thyroidectomy     total   TONSILLECTOMY  1997   TUBAL LIGATION     VAGINAL HYSTERECTOMY N/A 07/09/2015   Procedure: HYSTERECTOMY VAGINAL;  Surgeon: Suzy Bouchard, MD;  Location: [facility];  Service: Gynecology;  Laterality: N/A;    Prior to Admission medications   Medication Sig Start Date End Date Taking? Authorizing Provider  aspirin 81 MG EC tablet Take 81 mg by mouth daily.     [provider]  calcitRIOL (ROCALTROL) 0.25 MCG capsule Take by mouth.    [provider]  calcium carbonate (OS-CAL - DOSED IN MG OF ELEMENTAL CALCIUM) 1250 (500 CA) MG tablet Take 6 tablets by mouth daily.     [provider]  esomeprazole (NEXIUM) 20 MG capsule Take 20 mg by mouth daily at 12 noon.     [provider]  levothyroxine (SYNTHROID, LEVOTHROID) 100 MCG tablet Take 100 mcg by mouth daily before breakfast.     [provider]  Multiple Vitamin (MULTIVITAMIN) capsule Take 1 capsule by mouth daily.    [provider]  oxyCODONE-acetaminophen (ROXICET) 5-325 MG tablet Take 1-2 tablets by mouth every 4 (four) hours as needed for severe pain. Patient not taking: Reported on 10/03/2019 07/10/15   Schermerhorn, Ihor Austin, MD    Allergies Oxycodone  Family History  Problem Relation Age of Onset   Cancer Father        unsure which type of cancer   Breast cancer Neg Hx    Ovarian cancer Neg Hx    Colon cancer Neg Hx    Diabetes Neg Hx     Social  History Social History   Tobacco Use   Smoking status: Former    Packs/day: 0.25    Years: 6.00    Pack years: 1.50    Types: Cigarettes    Quit date: 09/15/1993    Years since quitting: 27.6   Smokeless tobacco: Never  Vaping Use   Vaping Use: Never used  Substance Use Topics   Alcohol use: Yes    Comment: rarely   Drug use: Never    Review of Systems  Constitutional: No fever/chills Eyes: No visual changes. ENT: No sore throat. Cardiovascular: Denies chest pain. Respiratory: Denies shortness of breath. Gastrointestinal: Positive for abdominal pain and nausea  no vomiting.  No diarrhea.  No constipation. Genitourinary: Negative for dysuria. Musculoskeletal: Negative for back pain. Skin: Negative for rash. Neurological: Negative for headaches, focal weakness or numbness.  ____________________________________________   PHYSICAL EXAM:  VITAL SIGNS: Vitals:   04/28/21 1130 04/28/21 1235  BP: (!) 107/57 104/62  Pulse: 71 66  Resp: 17 15  Temp:    SpO2: 100% 100%    Constitutional: Alert and oriented. Well appearing and in no acute distress.  Obese.  Laying on her left side and obviously uncomfortable. Eyes: Conjunctivae are injected bilaterally.  Pupils midrange and PERRL. EOMI. Head: Atraumatic. Nose: No congestion/rhinnorhea. Mouth/Throat: Mucous membranes are moist.  Oropharynx non-erythematous. Neck: No stridor. No cervical spine tenderness to palpation. Cardiovascular: Normal rate, regular rhythm. Grossly normal heart sounds.  Good peripheral circulation. Respiratory: Normal respiratory effort.  No retractions. Lungs CTAB. Gastrointestinal: Soft , nondistended. No CVA tenderness. Epigastric and periumbilical tenderness to palpation with voluntary guarding.  No peritoneal features. Musculoskeletal: No lower extremity tenderness nor edema.  No joint effusions. No signs of acute trauma. Neurologic:  Normal speech and language. No gross focal neurologic deficits  are appreciated. No gait instability noted. Skin:  Skin is warm, dry and intact. No rash noted. Psychiatric: Mood and affect are normal. Speech and behavior are normal. ____________________________________________   LABS (all labs ordered are listed, but only abnormal results are displayed)  Labs Reviewed  COMPREHENSIVE METABOLIC PANEL - Abnormal; Notable for the following components:      Result Value   Potassium 3.4 (*)    Glucose, Bld 124 (*)    Calcium 7.5 (*)    All other components within normal limits  URINALYSIS, COMPLETE (UACMP) WITH MICROSCOPIC - Abnormal; Notable for the following components:   Color, Urine YELLOW (*)    APPearance CLEAR (*)    All other components within normal limits  LIPASE, BLOOD  CBC  ETHANOL  POC URINE PREG, ED   ____________________________________________  12 Lead EKG  Sinus rhythm with a rate of 60 bpm.  Normal axis and intervals.  No evidence of acute ischemia. ____________________________________________  RADIOLOGY  ED MD interpretation:    Official radiology report(s): No results found.  ____________________________________________   PROCEDURES and INTERVENTIONS  Procedure(s) performed (including Critical Care):  .1-3 Lead EKG Interpretation  Date/Time: 04/28/2021 1:42 PM Performed by: Delton Prairie, MD Authorized by: Delton Prairie, MD     Interpretation: normal     ECG rate:  68   ECG rate assessment: normal     Rhythm: sinus rhythm     Ectopy: none     Conduction: normal    Medications  lactated ringers bolus 1,000 mL (1,000 mLs Intravenous New Bag/Given 04/28/21 1124)  ondansetron (ZOFRAN) injection 4 mg (4 mg Intravenous Given 04/28/21 1028)  pantoprazole (PROTONIX) injection 40 mg (40 mg Intravenous Given 04/28/21 1028)  famotidine (PEPCID) IVPB 20 mg premix (0 mg Intravenous Stopped 04/28/21 1122)  ketorolac (TORADOL) 30 MG/ML injection 15 mg (15 mg Intravenous Given 04/28/21 1120)     ____________________________________________   MDM / ED COURSE   50 year old woman presents to the ED with epigastric and periumbilical abdominal pain after drinking a little bit too much last night, without evidence of acute pathology, and amenable to outpatient management.  Normal vitals.  Exam with mild tenderness to her central and epigastric abdomen without peritoneal features.  No signs of neurologic or vascular deficits.  Initially looks quite uncomfortable, but after symptomatic measures this improves and she looks well.  Blood work is reassuring without evidence of acute pancreatitis, UTI or significant pathology.  She has no hematemesis or coffee-ground emesis to suggest upper GI bleed.  Provided H2 blocker, PPI and antiemetics with resolving symptoms.  We will discharge with recommendations to not drink so much.  Return precautions were discussed.  Clinical Course as of 04/28/21 1342  Sun Apr 28, 2021  1107 Reassessed.  Patient more calm and seemingly comfortable.  Resting on her right side with normal vital signs.  Discussed plan of care with husband. [DS]  1342 Patient reports feeling better and is requesting discharge. [DS]    Clinical Course User Index [DS] Delton Prairie, MD    ____________________________________________   FINAL CLINICAL IMPRESSION(S) / ED DIAGNOSES  Final diagnoses:  Epigastric pain     ED Discharge Orders     None        Thomasina Housley Katrinka Blazing   Note:  This document was prepared using Dragon voice recognition software and may include unintentional dictation errors.    Delton Prairie, MD 04/28/21 1343

## 2021-04-28 NOTE — ED Triage Notes (Signed)
Pt comes with c/o severe abdominal pain, N/V. Husband states pt drank last night and she drank a lot. Husband states she doesn't drink.  Pt admits to drinking liquor.

## 2021-04-28 NOTE — ED Notes (Signed)
Pt given water OK per EDP.

## 2021-04-28 NOTE — ED Notes (Signed)
Pt states she feels much better and wondering when she'll go home. Waiting on fluids to finish and MD to reassess. Pt verbalized understanding.

## 2021-04-28 NOTE — ED Notes (Signed)
Pt having episodes of falling asleep/not responsive then will wake up back to sternal rub and c/o stomach pain again. Dr. Katrinka Blazing at bedside.

## 2021-10-26 IMAGING — CR DG CHEST 2V
2 series · 2 of 2 positions shown · non-contrast
Comparison: None.

CLINICAL DATA: Chest pain

EXAM:
CHEST - 2 VIEW

[chest pa]
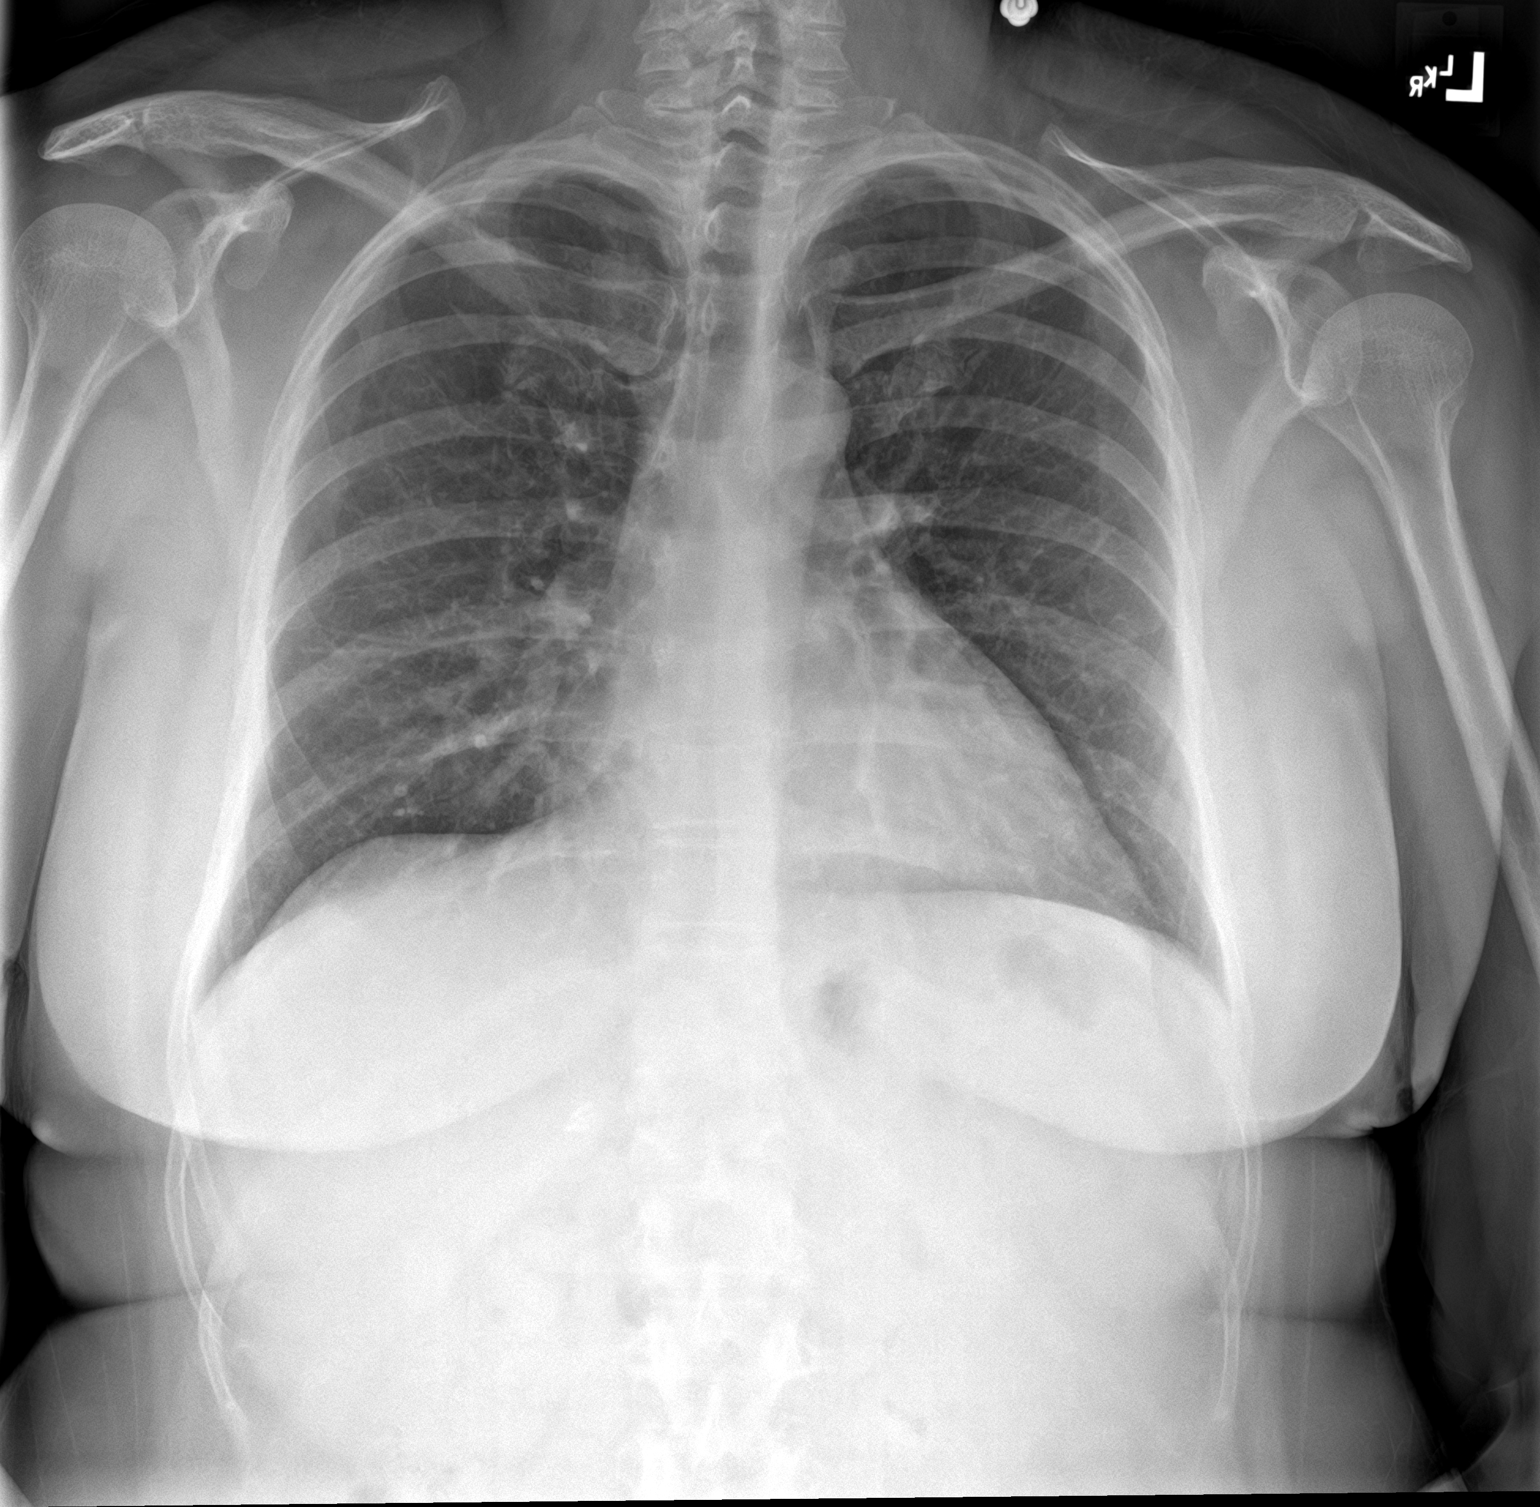

[chest lat]
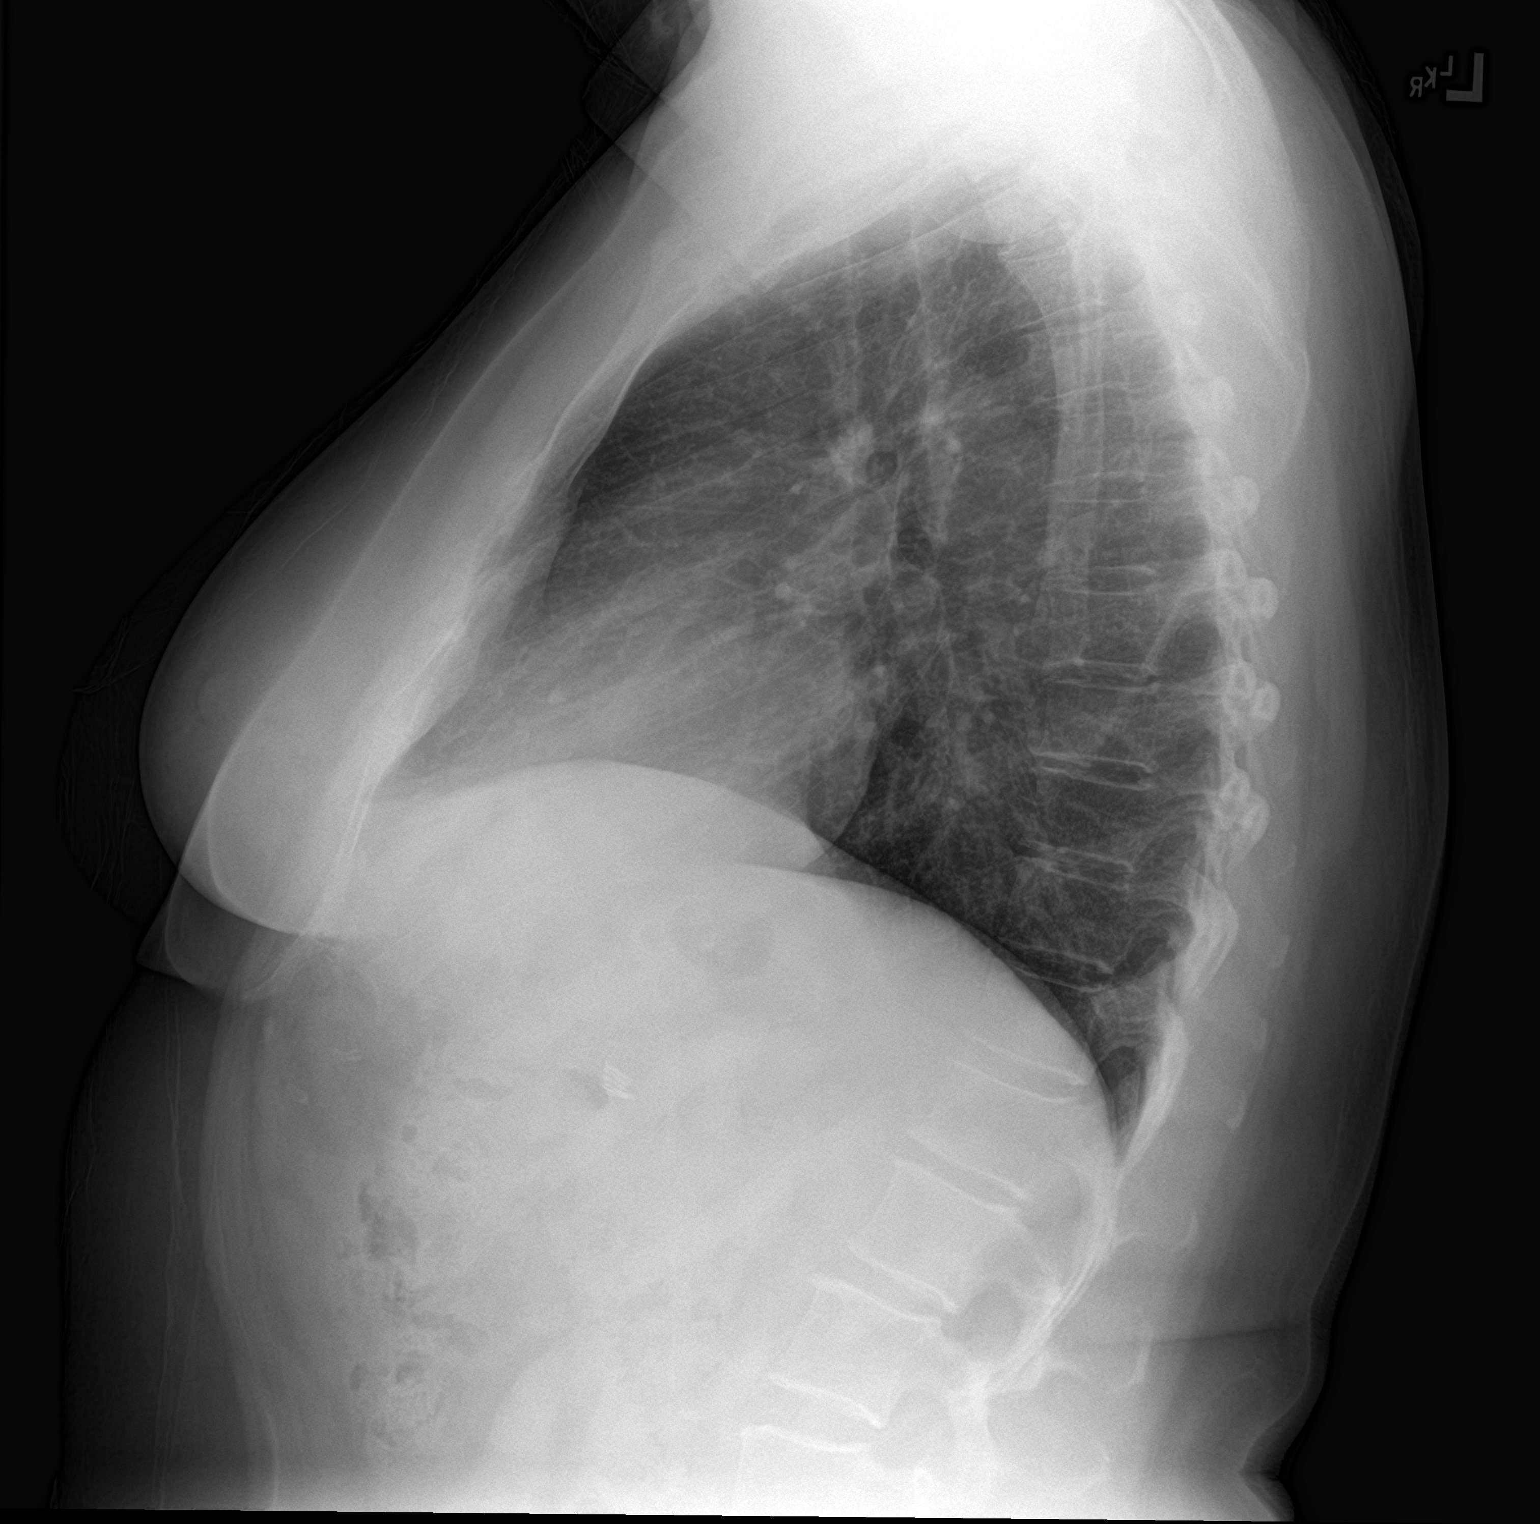

[2 of 2 positions shown; findings below may reference images not displayed]

FINDINGS: The heart size and mediastinal contours are within normal limits.
Both lungs are clear. No pleural effusion or pneumothorax. The
visualized skeletal structures are unremarkable. Cholecystectomy
clips.
IMPRESSION: No acute process in the chest

## 2022-03-31 ENCOUNTER — Other Ambulatory Visit: Payer: Self-pay | Admitting: Infectious Diseases

## 2022-03-31 DIAGNOSIS — Z1231 Encounter for screening mammogram for malignant neoplasm of breast: Secondary | ICD-10-CM

## 2022-04-15 LAB — COLOGUARD: COLOGUARD: NEGATIVE

## 2022-04-28 ENCOUNTER — Ambulatory Visit
Admission: RE | Admit: 2022-04-28 | Discharge: 2022-04-28 | Disposition: A | Discharge status: Home / Self Care | Payer: 59 | Source: Ambulatory Visit | Attending: Infectious Diseases | Admitting: Infectious Diseases

## 2022-04-28 DIAGNOSIS — Z1231 Encounter for screening mammogram for malignant neoplasm of breast: Secondary | ICD-10-CM | POA: Diagnosis present

## 2022-07-18 IMAGING — MG DIGITAL SCREENING BILAT W/ TOMO W/ CAD
6 of 10 series · 6 of 30 positions shown · non-contrast
Comparison: Previous exam(s).

CLINICAL DATA: Screening.

EXAM:
DIGITAL SCREENING BILATERAL MAMMOGRAM WITH TOMO AND CAD

[R MLO synth-2D (1 of 2)]
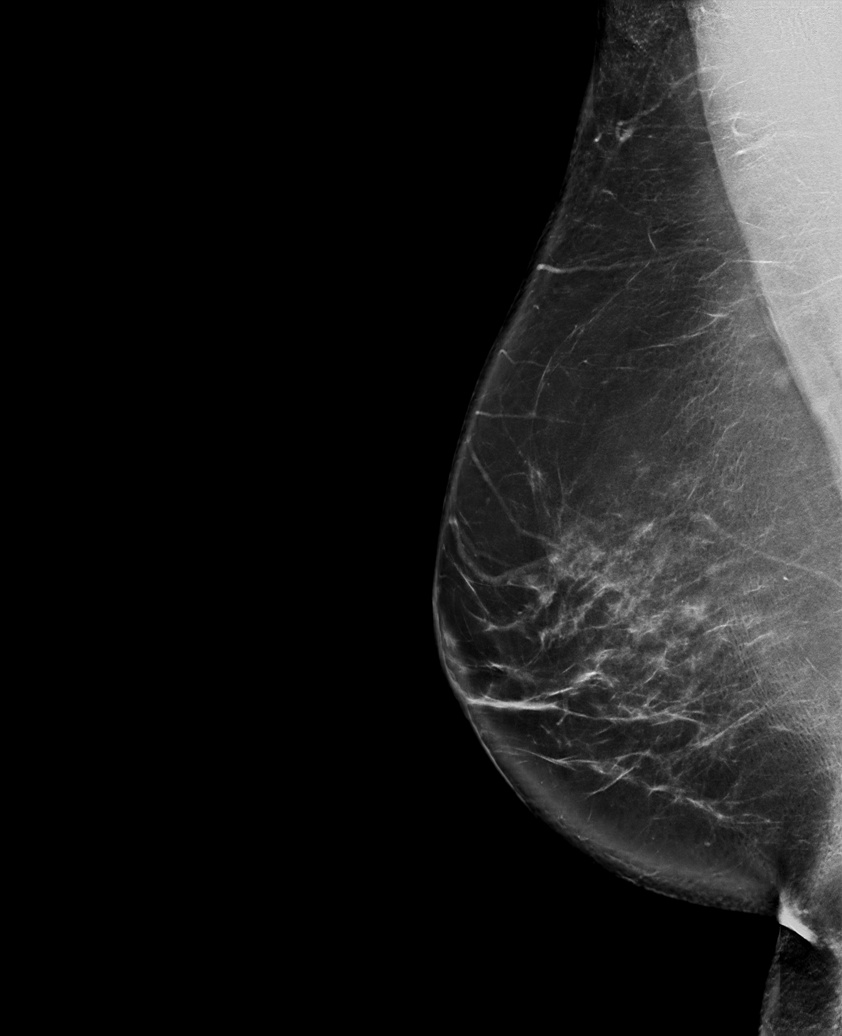

[R CC synth-2D]
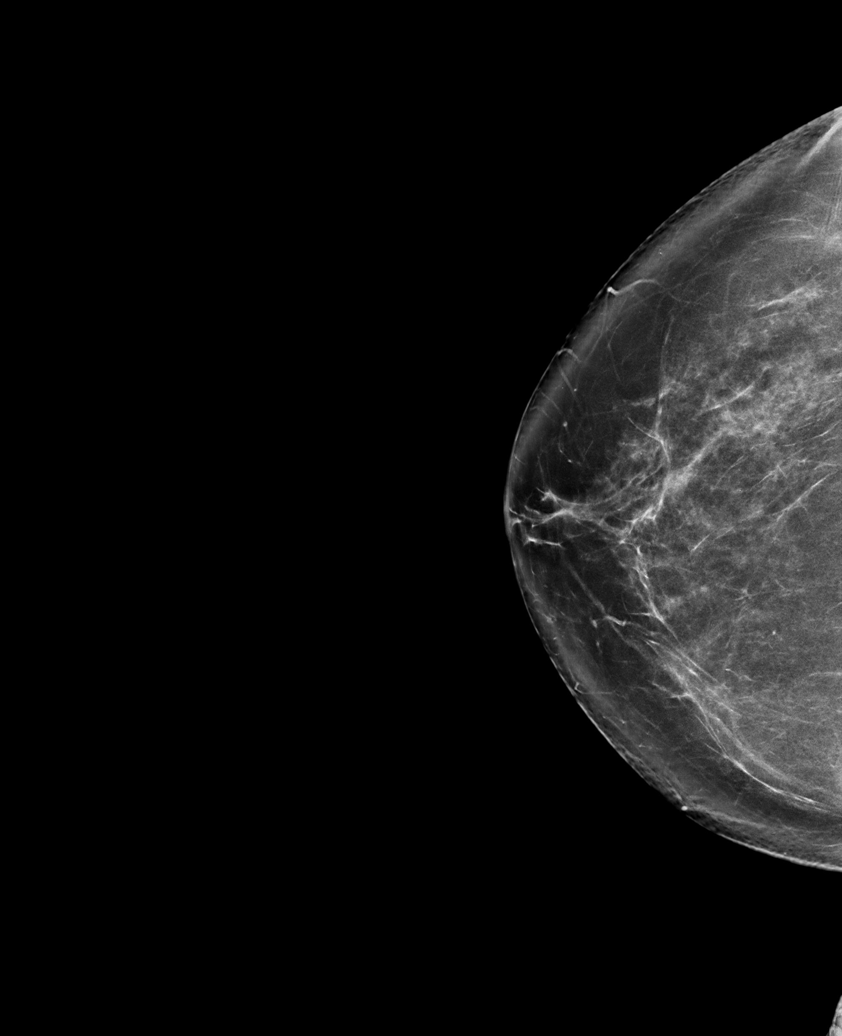

[L CC synth-2D]
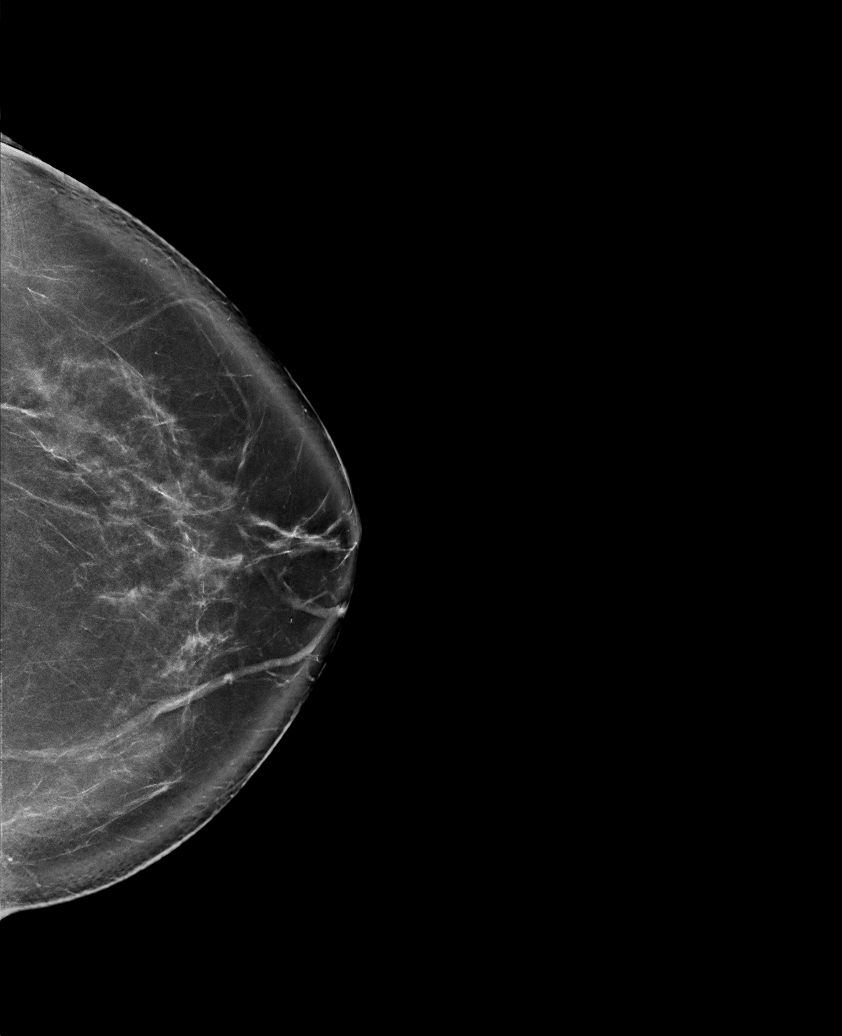

[R MLO synth-2D (2 of 2)]
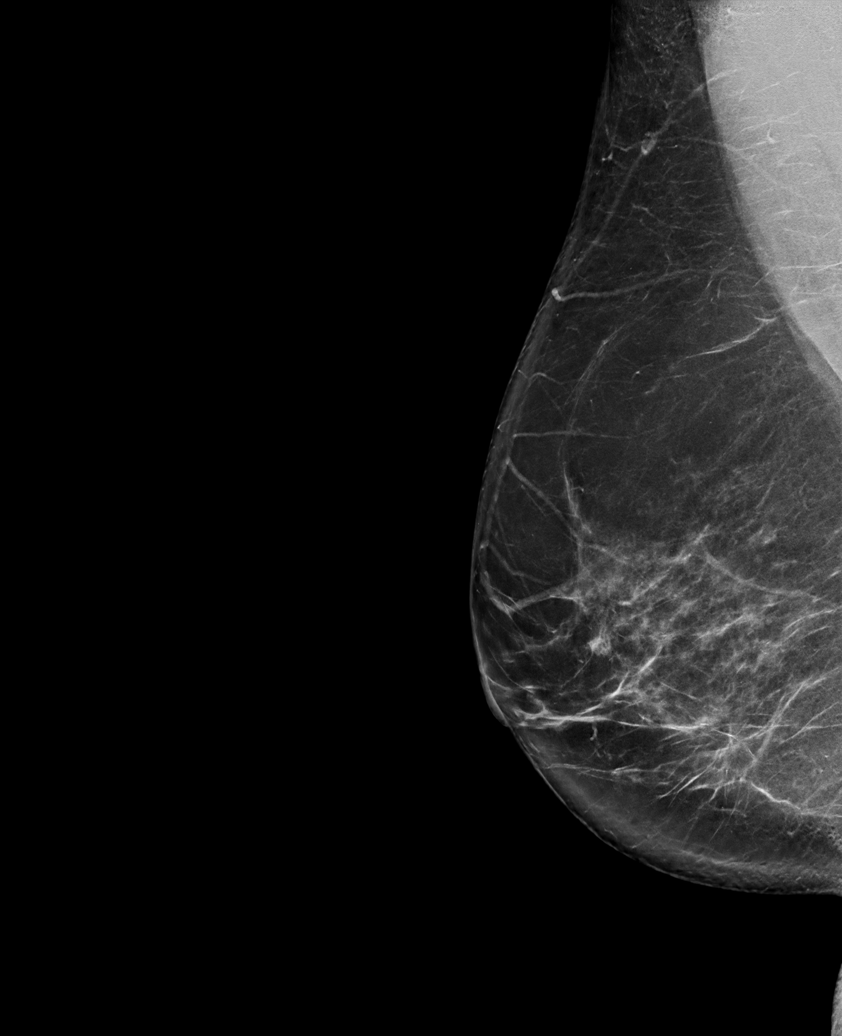

[L MLO synth-2D]
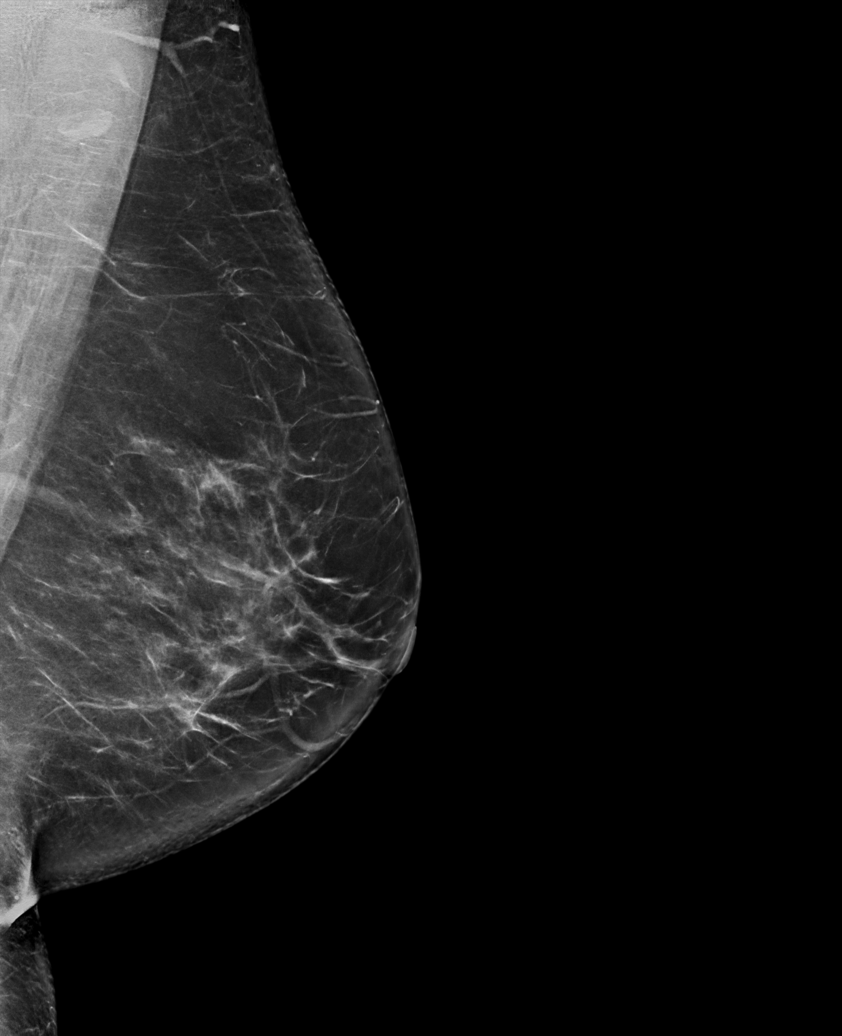

[R MLO tomo · tomo slice 53/105.0]
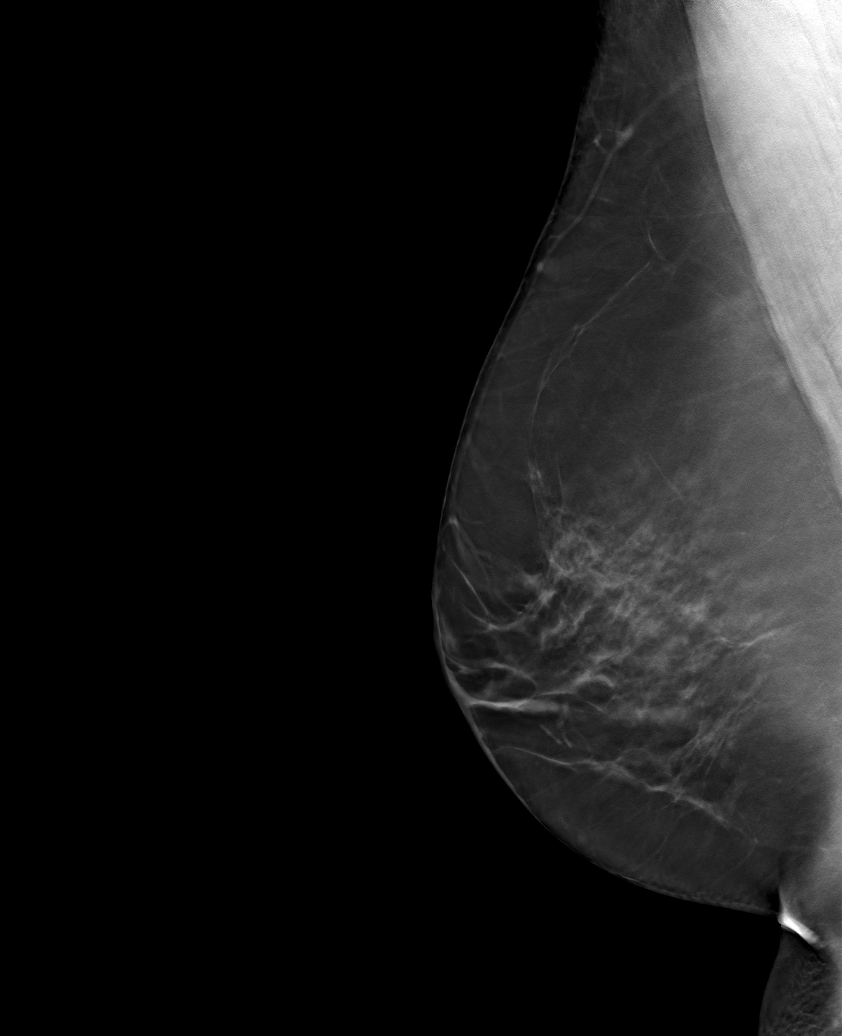

[6 of 30 positions shown; findings below may reference images not displayed]

ACR Breast Density Category c: The breast tissue is heterogeneously
dense, which may obscure small masses.
FINDINGS: There are no findings suspicious for malignancy. Images were
processed with CAD.
IMPRESSION: No mammographic evidence of malignancy. A result letter of this
screening mammogram will be mailed directly to the patient.

RECOMMENDATION:
Screening mammogram in one year. (Code:[id number])

BI-RADS CATEGORY  1: Negative.

## 2022-08-19 ENCOUNTER — Encounter: Payer: Self-pay | Admitting: Gastroenterology

## 2022-08-19 ENCOUNTER — Ambulatory Visit (INDEPENDENT_AMBULATORY_CARE_PROVIDER_SITE_OTHER): Payer: 59 | Admitting: Gastroenterology

## 2022-08-19 VITALS — BP 128/80 | HR 80 | Temp 98.4°F | Ht 64.0 in | Wt 200.0 lb

## 2022-08-19 DIAGNOSIS — K219 Gastro-esophageal reflux disease without esophagitis: Secondary | ICD-10-CM | POA: Diagnosis not present

## 2022-08-19 MED ORDER — ESOMEPRAZOLE MAGNESIUM 40 MG PO CPDR: 40.0000 mg | DELAYED_RELEASE_CAPSULE | Freq: Every day | ORAL | 1 refills | Status: DC

## 2022-08-19 NOTE — Progress Notes (Addendum)
Gastroenterology Consultation  Referring Provider:     Gigi Gin, [place] Primary Care Physician:  Mick Sell, MD Primary Gastroenterologist:  Dr. Servando Snare     Reason for Consultation:     GERD        HPI:   Abigail Lawson is a 51 y.o. y/o female referred for consultation & management of GERD by Dr. Sampson Goon, Stann Mainland, MD. This patient comes in today with a history of gastroesophageal reflux disease.  The patient had a Cologuard earlier this year that was negative.  In July it appears that the patient had been on Protonix 40 mg a day.  The patient reports that she still takes Tums and has acid breakthrough on the pantoprazole.  She also reports that her symptoms are usually in the evening.  She takes her medication a half hour before lunch.  There is no report of any unexplained weight loss fevers chills nausea or vomiting.  The patient does report that she feels like she did better on the Nexium over-the-counter medication.  She was seen by ENT who recommended her following up with me for possible endoscopy since she has had chronic heartburn.  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   Hypothyroidism    Thyroid disease     Past Surgical History:  Procedure Laterality Date   ABDOMINAL HYSTERECTOMY     BILATERAL SALPINGECTOMY  07/09/2015   Procedure: BILATERAL SALPINGECTOMY;  Surgeon: Suzy Bouchard, MD;  Location: [facility];  Service: Gynecology;;   CHOLECYSTECTOMY  2013   OOPHORECTOMY Right 07/09/2015   Procedure: Alma Friendly;  Surgeon: Suzy Bouchard, MD;  Location: [facility];  Service: Gynecology;  Laterality: Right;   thyroidectomy     total   TONSILLECTOMY  1997   TUBAL LIGATION     VAGINAL HYSTERECTOMY N/A 07/09/2015   Procedure: HYSTERECTOMY  VAGINAL;  Surgeon: Suzy Bouchard, MD;  Location: [facility];  Service: Gynecology;  Laterality: N/A;    Prior to Admission medications   Medication Sig Start Date End Date Taking? Authorizing Provider  aspirin 81 MG EC tablet Take 81 mg by mouth daily.     [provider]  calcitRIOL (ROCALTROL) 0.25 MCG capsule Take by mouth.    [provider]  calcium carbonate (OS-CAL - DOSED IN MG OF ELEMENTAL CALCIUM) 1250 (500 CA) MG tablet Take 6 tablets by mouth daily.     [provider]  esomeprazole (NEXIUM) 20 MG capsule Take 20 mg by mouth daily at 12 noon.     [provider]  levothyroxine (SYNTHROID, LEVOTHROID) 100 MCG tablet Take 100 mcg by mouth daily before breakfast.     [provider]  Multiple Vitamin (MULTIVITAMIN) capsule Take 1 capsule by mouth daily.    [provider]  oxyCODONE-acetaminophen (ROXICET) 5-325 MG tablet Take 1-2 tablets by mouth every 4 (four) hours as needed for severe pain. Patient not taking: Reported on 10/03/2019 07/10/15   Schermerhorn, Ihor Austin, MD    Family History  Problem Relation Age of Onset   Cancer Father        unsure which type of cancer   Breast cancer Neg Hx    Ovarian cancer Neg Hx    Colon cancer Neg Hx  Diabetes Neg Hx      Social History   Tobacco Use   Smoking status: Former    Packs/day: 0.25    Years: 6.00    Total pack years: 1.50    Types: Cigarettes    Quit date: 09/15/1993    Years since quitting: 28.9   Smokeless tobacco: Never  Vaping Use   Vaping Use: Never used  Substance Use Topics   Alcohol use: Yes    Comment: rarely   Drug use: Never    Allergies as of 08/19/2022 - Review Complete 08/19/2022  Allergen Reaction Noted   Oxycodone Itching 07/11/2015    Review of Systems:    All systems reviewed and negative except where noted in HPI.   Physical Exam:  BP 128/80 (BP Location: Left Arm, Patient Position: Sitting, Cuff Size: Large)   Pulse 80    Temp 98.4 F (36.9 C) (Oral)   Ht 5\' 4"  (1.626 m)   Wt 200 lb (90.7 kg)   LMP 06/25/2015   BMI 34.33 kg/m  Patient's last menstrual period was 06/25/2015. General:   Alert,  Well-developed, well-nourished, pleasant and cooperative in NAD Head:  Normocephalic and atraumatic. Eyes:  Sclera clear, no icterus.   Conjunctiva pink. Ears:  Normal auditory acuity. Neck:  Supple; no masses or thyromegaly. Lungs:  Respirations even and unlabored.  Clear throughout to auscultation.   No wheezes, crackles, or rhonchi. No acute distress. Heart:  Regular rate and rhythm; no murmurs, clicks, rubs, or gallops. Abdomen:  Normal bowel sounds.  No bruits.  Soft, non-tender and non-distended without masses, hepatosplenomegaly or hernias noted.  No guarding or rebound tenderness.  Negative Carnett sign.   Rectal:  Deferred.  Pulses:  Normal pulses noted. Extremities:  No clubbing or edema.  No cyanosis. Neurologic:  Alert and oriented x3;  grossly normal neurologically. Skin:  Intact without significant lesions or rashes.  No jaundice. Lymph Nodes:  No significant cervical adenopathy. Psych:  Alert and cooperative. Normal mood and affect.  Imaging Studies: No results found.  Assessment and Plan:   THERSA Lawson is a 51 y.o. y/o female Who comes in today with a history of acid breakthrough on the Protonix.  The patient has been told to restart the Nexium and his been given a prescription for Nexium 40 mg to be taken every day.  The patient will also be set up for an upper endoscopy due to her long-standing history of heartburn.  The patient has been explained the plan and agrees with it.    40, MD. Midge Minium    Note: This dictation was prepared with Dragon dictation along with smaller phrase technology. Any transcriptional errors that result from this process are unintentional.

## 2022-08-20 NOTE — Addendum Note (Signed)
Addended by: Roena Malady on: 08/20/2022 11:08 AM   Modules accepted: Orders

## 2022-09-04 ENCOUNTER — Ambulatory Visit
Admission: RE | Admit: 2022-09-04 | Discharge: 2022-09-04 | Disposition: A | Discharge status: Home / Self Care | Payer: 59 | Attending: Gastroenterology | Admitting: Gastroenterology

## 2022-09-04 ENCOUNTER — Encounter
Admission: RE | Disposition: A | Discharge status: Home / Self Care | Payer: Self-pay | Source: Home / Self Care | Attending: Gastroenterology

## 2022-09-04 ENCOUNTER — Other Ambulatory Visit: Payer: Self-pay

## 2022-09-04 ENCOUNTER — Ambulatory Visit: Payer: 59 | Admitting: Certified Registered"

## 2022-09-04 ENCOUNTER — Encounter: Payer: Self-pay | Admitting: Gastroenterology

## 2022-09-04 DIAGNOSIS — K219 Gastro-esophageal reflux disease without esophagitis: Secondary | ICD-10-CM | POA: Diagnosis present

## 2022-09-04 DIAGNOSIS — Z9049 Acquired absence of other specified parts of digestive tract: Secondary | ICD-10-CM | POA: Insufficient documentation

## 2022-09-04 DIAGNOSIS — E890 Postprocedural hypothyroidism: Secondary | ICD-10-CM | POA: Insufficient documentation

## 2022-09-04 DIAGNOSIS — K449 Diaphragmatic hernia without obstruction or gangrene: Secondary | ICD-10-CM | POA: Insufficient documentation

## 2022-09-04 DIAGNOSIS — Z87891 Personal history of nicotine dependence: Secondary | ICD-10-CM | POA: Insufficient documentation

## 2022-09-04 HISTORY — PX: ESOPHAGOGASTRODUODENOSCOPY (EGD) WITH PROPOFOL: SHX5813

## 2022-09-04 SURGERY — ESOPHAGOGASTRODUODENOSCOPY (EGD) WITH PROPOFOL
Anesthesia: General

## 2022-09-04 MED ORDER — SODIUM CHLORIDE 0.9 % IV SOLN: INTRAVENOUS | Status: DC

## 2022-09-04 MED ORDER — DEXMEDETOMIDINE HCL IN NACL 80 MCG/20ML IV SOLN
INTRAVENOUS | Status: DC | PRN
  Administered 2022-09-04: 8 ug via BUCCAL

## 2022-09-04 MED ORDER — LIDOCAINE HCL (CARDIAC) PF 100 MG/5ML IV SOSY
PREFILLED_SYRINGE | INTRAVENOUS | Status: DC | PRN
  Administered 2022-09-04: 100 mg via INTRAVENOUS

## 2022-09-04 MED ORDER — PROPOFOL 10 MG/ML IV BOLUS
INTRAVENOUS | Status: DC | PRN
  Administered 2022-09-04: 40 mg via INTRAVENOUS
  Administered 2022-09-04: 80 mg via INTRAVENOUS

## 2022-09-04 NOTE — H&P (Signed)
Lucilla Lame, MD [address], [place] [address] [phone] Fax : [phone]  Primary Care Physician:  Leonel Ramsay, MD Primary Gastroenterologist:  Dr. Allen Norris  Pre-Procedure History & Physical: HPI:  Abigail Lawson is a 51 y.o. female is here for an endoscopy.   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   Hypothyroidism    Thyroid disease     Past Surgical History:  Procedure Laterality Date   ABDOMINAL HYSTERECTOMY     BILATERAL SALPINGECTOMY  07/09/2015   Procedure: BILATERAL SALPINGECTOMY;  Surgeon: Boykin Nearing, MD;  Location: [facility];  Service: Gynecology;;   CHOLECYSTECTOMY  2013   OOPHORECTOMY Right 07/09/2015   Procedure: Morene Crocker;  Surgeon: Boykin Nearing, MD;  Location: [facility];  Service: Gynecology;  Laterality: Right;   thyroidectomy     total   TONSILLECTOMY  1997   TUBAL LIGATION     VAGINAL HYSTERECTOMY N/A 07/09/2015   Procedure: HYSTERECTOMY VAGINAL;  Surgeon: Boykin Nearing, MD;  Location: [facility];  Service: Gynecology;  Laterality: N/A;    Prior to Admission medications   Medication Sig Start Date End Date Taking? Authorizing Provider  esomeprazole (NEXIUM) 40 MG capsule Take 1 capsule (40 mg total) by mouth daily at 12 noon. 08/19/22  Yes Lucilla Lame, MD  levothyroxine (SYNTHROID, LEVOTHROID) 100 MCG tablet Take 100 mcg by mouth daily before breakfast.    Yes [provider]  calcitRIOL (ROCALTROL) 0.25 MCG capsule Take by mouth.    [provider]  calcium carbonate (OS-CAL - DOSED IN MG OF ELEMENTAL CALCIUM) 1250 (500 CA) MG tablet Take 6 tablets by mouth daily.     [provider]  Multiple Vitamin (MULTIVITAMIN) capsule Take 1 capsule by mouth daily.     [provider]  pantoprazole (PROTONIX) 40 MG tablet Take 40 mg by mouth daily.    [provider]    Allergies as of 08/20/2022 - Review Complete 08/19/2022  Allergen Reaction Noted   Oxycodone Itching 07/11/2015    Family History  Problem Relation Age of Onset   Cancer Father        unsure which type of cancer   Breast cancer Neg Hx    Ovarian cancer Neg Hx    Colon cancer Neg Hx    Diabetes Neg Hx     Social History   Socioeconomic History   Marital status: Married    Spouse name: Not on file   Number of children: Not on file   Years of education: Not on file   Highest education level: Not on file  Occupational History   Not on file  Tobacco Use   Smoking status: Former    Packs/day: 0.25    Years: 6.00    Total pack years: 1.50    Types: Cigarettes    Quit date: 09/15/1993    Years since quitting: 28.9   Smokeless tobacco: Never  Vaping Use   Vaping Use: Never used  Substance and Sexual Activity   Alcohol use: Yes    Comment: rarely   Drug use: Never   Sexual activity: Yes    Birth control/protection: Surgical  Other Topics Concern   Not on file  Social History Narrative  Not on file   Social Determinants of Health   Financial Resource Strain: Not on file  Food Insecurity: Not on file  Transportation Needs: Not on file  Physical Activity: Not on file  Stress: Not on file  Social Connections: Not on file  Intimate Partner Violence: Not on file    Review of Systems: See HPI, otherwise negative ROS  Physical Exam: BP (!) 118/44   Pulse 61   Temp (!) 96.6 F (35.9 C) (Temporal)   Resp 16   Ht 5\' 4"  (1.626 m)   Wt 90.2 kg   LMP 06/25/2015   SpO2 98%   BMI 34.12 kg/m  General:   Alert,  pleasant and cooperative in NAD Head:  Normocephalic and atraumatic. Neck:  Supple; no masses or thyromegaly. Lungs:  Clear throughout to auscultation.    Heart:  Regular rate and rhythm. Abdomen:  Soft, nontender and nondistended.  Normal bowel sounds, without guarding, and without rebound.   Neurologic:  Alert and  oriented x4;  grossly normal neurologically.  Impression/Plan: Abigail Lawson is here for an endoscopy to be performed for GERD  Risks, benefits, limitations, and alternatives regarding  endoscopy have been reviewed with the patient.  Questions have been answered.  All parties agreeable.   Jacob Moores, MD  09/04/2022, 9:12 AM

## 2022-09-04 NOTE — Anesthesia Preprocedure Evaluation (Signed)
Anesthesia Evaluation  Patient identified by MRN, date of birth, ID band Patient awake    Reviewed: Allergy & Precautions, NPO status , Patient's Chart, lab work & pertinent test results  Airway Mallampati: III  TM Distance: >3 FB Neck ROM: Full    Dental  (+) Teeth Intact   Pulmonary neg pulmonary ROS, Patient abstained from smoking., former smoker   Pulmonary exam normal breath sounds clear to auscultation       Cardiovascular Exercise Tolerance: Good negative cardio ROS Normal cardiovascular exam Rhythm:Regular Rate:Normal     Neuro/Psych  Headaches negative neurological ROS  negative psych ROS   GI/Hepatic negative GI ROS, Neg liver ROS,GERD  ,,  Endo/Other  negative endocrine ROSHypothyroidism    Renal/GU negative Renal ROS  negative genitourinary   Musculoskeletal   Abdominal   Peds negative pediatric ROS (+)  Hematology negative hematology ROS (+) Blood dyscrasia, anemia   Anesthesia Other Findings 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 No date: Hypothyroidism No date: Thyroid disease  Past Surgical History: No date: ABDOMINAL HYSTERECTOMY 07/09/2015: BILATERAL SALPINGECTOMY     Comment:  Procedure: BILATERAL SALPINGECTOMY;  Surgeon: Suzy Bouchard, MD;  Location: [facility];  Service:               Gynecology;; 2013: CHOLECYSTECTOMY 07/09/2015: OOPHORECTOMY; Right     Comment:  Procedure: OOPHORECTOMY;  Surgeon: Suzy Bouchard, MD;  Location: [facility];  Service:               Gynecology;  Laterality: Right; No date: thyroidectomy     Comment:  total 1997: TONSILLECTOMY No date: TUBAL LIGATION 07/09/2015: VAGINAL HYSTERECTOMY; N/A      Comment:  Procedure: HYSTERECTOMY VAGINAL;  Surgeon: Suzy Bouchard, MD;  Location: [facility];  Service:               Gynecology;  Laterality: N/A;  BMI    Body Mass Index: 34.12 kg/m      Reproductive/Obstetrics negative OB ROS                             Anesthesia Physical Anesthesia Plan  ASA: 2  Anesthesia Plan: General   Post-op Pain Management:    Induction: Intravenous  PONV Risk Score and Plan: Propofol infusion and TIVA  Airway Management Planned: Natural Airway  Additional Equipment:   Intra-op Plan:   Post-operative Plan:   Informed Consent: I have reviewed the patients History and Physical, chart, labs and discussed the procedure including the risks, benefits and alternatives for the proposed anesthesia with the patient or authorized representative who has indicated his/her understanding and acceptance.     Dental Advisory Given  Plan Discussed with: CRNA and Surgeon  Anesthesia Plan Comments:        Anesthesia Quick Evaluation

## 2022-09-04 NOTE — Anesthesia Postprocedure Evaluation (Signed)
Anesthesia Post Note  Patient: ANICA ALCARAZ  Procedure(s) Performed: ESOPHAGOGASTRODUODENOSCOPY (EGD) WITH PROPOFOL  Patient location during evaluation: PACU Anesthesia Type: General Level of consciousness: awake Pain management: pain level controlled Vital Signs Assessment: post-procedure vital signs reviewed and stable Respiratory status: spontaneous breathing and nonlabored ventilation Cardiovascular status: stable Anesthetic complications: no  No notable events documented.   Last Vitals:  Vitals:   09/04/22 0938 09/04/22 0948  BP: (!) 91/57 119/77  Pulse: 60 60  Resp: 12 11  Temp:    SpO2: 98% 98%    Last Pain:  Vitals:   09/04/22 0948  TempSrc:   PainSc: 0-No pain                 VAN STAVEREN,Wrenley Sayed

## 2022-09-04 NOTE — Anesthesia Procedure Notes (Signed)
Procedure Name: MAC Date/Time: 09/04/2022 9:19 AM  Performed by: Biagio Borg, CRNAPre-anesthesia Checklist: Patient identified, Emergency Drugs available, Suction available, Patient being monitored and Timeout performed Patient Re-evaluated:Patient Re-evaluated prior to induction Oxygen Delivery Method: Nasal cannula Induction Type: IV induction Placement Confirmation: positive ETCO2 and CO2 detector

## 2022-09-04 NOTE — Transfer of Care (Signed)
Immediate Anesthesia Transfer of Care Note  Patient: Abigail Lawson  Procedure(s) Performed: ESOPHAGOGASTRODUODENOSCOPY (EGD) WITH PROPOFOL  Patient Location: PACU and Endoscopy Unit  Anesthesia Type:General  Level of Consciousness: awake  Airway & Oxygen Therapy: Patient Spontanous Breathing  Post-op Assessment: Report given to RN and Post -op Vital signs reviewed and stable  Post vital signs: Reviewed and stable  Last Vitals:  Vitals Value Taken Time  BP    Temp    Pulse    Resp    SpO2      Last Pain:  Vitals:   09/04/22 0849  TempSrc: Temporal  PainSc: 0-No pain         Complications: No notable events documented.

## 2022-09-04 NOTE — Op Note (Signed)
[facility] Gastroenterology Patient Name: Abigail Lawson Procedure Date: 09/04/2022 9:11 AM MRN: [id number] Account #: [id number] Date of Birth: 1971/05/23 Admit Type: Outpatient Age: 51 Room: [facility] ENDO ROOM 4 Gender: Female Note Status: Finalized Instrument Name: Patton Salles Endoscope [id number] Procedure:             Upper GI endoscopy Indications:           Heartburn Providers:             Midge Minium MD, MD Medicines:             Propofol per Anesthesia Complications:         No immediate complications. Procedure:             Pre-Anesthesia Assessment:                        - Prior to the procedure, a History and Physical was                         performed, and patient medications and allergies were                         reviewed. The patient's tolerance of previous                         anesthesia was also reviewed. The risks and benefits                         of the procedure and the sedation options and risks                         were discussed with the patient. All questions were                         answered, and informed consent was obtained. Prior                         Anticoagulants: The patient has taken no anticoagulant                         or antiplatelet agents. ASA Grade Assessment: II - A                         patient with mild systemic disease. After reviewing                         the risks and benefits, the patient was deemed in                         satisfactory condition to undergo the procedure.                        After obtaining informed consent, the endoscope was                         passed under direct vision. Throughout the procedure,                         the patient's blood pressure,  pulse, and oxygen                         saturations were monitored continuously. The Endoscope                         was introduced through the mouth, and advanced to the                         second part of duodenum.  The upper GI endoscopy was                         accomplished without difficulty. The patient tolerated                         the procedure well. Findings:      The Z-line was irregular and was found at the gastroesophageal junction.      A small hiatal hernia was present.      The stomach was normal.      The examined duodenum was normal. Impression:            - Z-line irregular, at the gastroesophageal junction.                        - Small hiatal hernia.                        - Normal stomach.                        - Normal examined duodenum.                        - No specimens collected. Recommendation:        - Discharge patient to home.                        - Resume previous diet.                        - Continue present medications. 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 All rights reserved. The codes documented in this report are preliminary and upon  coder review may  be revised to meet current compliance requirements. Midge Minium MD, MD 09/04/2022 9:25:47 AM This report has been signed electronically. Number of Addenda: 0 Note Initiated On: 09/04/2022 9:11 AM Estimated Blood Loss:  Estimated blood loss: none.      [facility]

## 2023-06-01 ENCOUNTER — Other Ambulatory Visit: Payer: Self-pay | Admitting: Gastroenterology

## 2023-10-05 ENCOUNTER — Other Ambulatory Visit: Payer: Self-pay | Admitting: Gastroenterology

## 2023-10-14 ENCOUNTER — Other Ambulatory Visit: Payer: Self-pay | Admitting: Podiatry

## 2023-10-14 DIAGNOSIS — M7661 Achilles tendinitis, right leg: Secondary | ICD-10-CM

## 2023-10-14 DIAGNOSIS — M7731 Calcaneal spur, right foot: Secondary | ICD-10-CM

## 2023-10-14 DIAGNOSIS — M216X1 Other acquired deformities of right foot: Secondary | ICD-10-CM

## 2023-10-14 DIAGNOSIS — M79671 Pain in right foot: Secondary | ICD-10-CM

## 2023-10-15 ENCOUNTER — Encounter: Payer: Self-pay | Admitting: Podiatry

## 2023-11-12 ENCOUNTER — Encounter: Payer: Self-pay | Admitting: Podiatry

## 2023-11-17 ENCOUNTER — Ambulatory Visit
Admission: RE | Admit: 2023-11-17 | Discharge: 2023-11-17 | Disposition: A | Discharge status: Home / Self Care | Payer: 59 | Source: Ambulatory Visit | Attending: Podiatry | Admitting: Podiatry

## 2023-11-17 DIAGNOSIS — M216X1 Other acquired deformities of right foot: Secondary | ICD-10-CM

## 2023-11-17 DIAGNOSIS — M7662 Achilles tendinitis, left leg: Secondary | ICD-10-CM

## 2023-11-17 DIAGNOSIS — M7731 Calcaneal spur, right foot: Secondary | ICD-10-CM

## 2023-11-17 DIAGNOSIS — M79671 Pain in right foot: Secondary | ICD-10-CM

## 2024-04-12 ENCOUNTER — Other Ambulatory Visit: Payer: Self-pay | Admitting: Infectious Diseases

## 2024-04-12 DIAGNOSIS — Z1231 Encounter for screening mammogram for malignant neoplasm of breast: Secondary | ICD-10-CM

## 2024-04-13 ENCOUNTER — Other Ambulatory Visit: Payer: Self-pay | Admitting: Infectious Diseases

## 2024-04-13 DIAGNOSIS — Z0000 Encounter for general adult medical examination without abnormal findings: Secondary | ICD-10-CM

## 2024-04-13 DIAGNOSIS — R079 Chest pain, unspecified: Secondary | ICD-10-CM

## 2024-04-13 DIAGNOSIS — E892 Postprocedural hypoparathyroidism: Secondary | ICD-10-CM

## 2024-04-13 DIAGNOSIS — E890 Postprocedural hypothyroidism: Secondary | ICD-10-CM

## 2024-04-13 DIAGNOSIS — D500 Iron deficiency anemia secondary to blood loss (chronic): Secondary | ICD-10-CM

## 2024-04-13 DIAGNOSIS — Z1331 Encounter for screening for depression: Secondary | ICD-10-CM

## 2024-04-27 ENCOUNTER — Ambulatory Visit
Admission: RE | Admit: 2024-04-27 | Discharge: 2024-04-27 | Disposition: A | Discharge status: Home / Self Care | Payer: Self-pay | Source: Ambulatory Visit | Attending: Infectious Diseases | Admitting: Infectious Diseases

## 2024-04-27 DIAGNOSIS — Z0000 Encounter for general adult medical examination without abnormal findings: Secondary | ICD-10-CM | POA: Insufficient documentation

## 2024-04-27 DIAGNOSIS — D500 Iron deficiency anemia secondary to blood loss (chronic): Secondary | ICD-10-CM | POA: Insufficient documentation

## 2024-04-27 DIAGNOSIS — Z1331 Encounter for screening for depression: Secondary | ICD-10-CM | POA: Insufficient documentation

## 2024-04-27 DIAGNOSIS — E890 Postprocedural hypothyroidism: Secondary | ICD-10-CM | POA: Insufficient documentation

## 2024-04-27 DIAGNOSIS — E892 Postprocedural hypoparathyroidism: Secondary | ICD-10-CM | POA: Insufficient documentation

## 2024-04-27 DIAGNOSIS — R079 Chest pain, unspecified: Secondary | ICD-10-CM | POA: Insufficient documentation

## 2024-07-11 ENCOUNTER — Other Ambulatory Visit: Payer: Self-pay | Admitting: Podiatry

## 2024-07-22 ENCOUNTER — Encounter
Admission: RE | Admit: 2024-07-22 | Discharge: 2024-07-22 | Disposition: A | Discharge status: Home / Self Care | Source: Ambulatory Visit | Attending: Podiatry | Admitting: Podiatry

## 2024-07-22 ENCOUNTER — Other Ambulatory Visit: Payer: Self-pay

## 2024-07-22 HISTORY — DX: Calcaneal spur, right foot: M77.31

## 2024-07-22 HISTORY — DX: Hypocalcemia: E83.51

## 2024-07-22 HISTORY — DX: Achilles tendinitis, right leg: M76.61

## 2024-07-22 HISTORY — DX: Calcaneal spur, left foot: M77.32

## 2024-07-22 HISTORY — DX: Postprocedural hypoparathyroidism: E89.2

## 2024-07-22 HISTORY — DX: Pain in right foot: M79.671

## 2024-07-22 HISTORY — DX: Other acquired deformities of right foot: M21.6X1

## 2024-07-22 NOTE — Patient Instructions (Addendum)
 Your procedure is scheduled on: 07/29/24 - Friday Report to the Registration Desk on the 1st floor of the [facility]. To find out your arrival time, please call [phone] between 1PM - 3PM on: 07/28/24 - Thursday If your arrival time is 6:00 am, do not arrive before that time as the [facility] entrance doors do not open until 6:00 am.  REMEMBER: Instructions that are not followed completely may result in serious medical risk, up to and including death; or upon the discretion of your surgeon and anesthesiologist your surgery may need to be rescheduled.  Do not eat food after midnight the night before surgery.  No gum chewing or hard candies.  You may however, drink CLEAR liquids up to 2 hours before you are scheduled to arrive for your surgery. Do not drink anything within 2 hours of your scheduled arrival time.  Clear liquids include: - water  - apple juice without pulp - gatorade (not RED colors) - black coffee or tea (Do NOT add milk or creamers to the coffee or tea) Do NOT drink anything that is not on this list.  In addition, your doctor has ordered for you to drink the provided:  Ensure Pre-Surgery Clear Carbohydrate Drink  Drinking this carbohydrate drink up to two hours before surgery helps to reduce insulin resistance and improve patient outcomes. Please complete drinking 2 hours before scheduled arrival time.  One week prior to surgery: Stop Anti-inflammatories (NSAIDS) such as Advil , Aleve , Ibuprofen , Motrin , Naproxen , Naprosyn  and Aspirin based products such as Excedrin, Goody's Powder, BC Powder. You may take Tylenol  if needed for pain up until the day of surgery.  Stop ANY OVER THE COUNTER supplements until after surgery .  ON THE DAY OF SURGERY ONLY TAKE THESE MEDICATIONS WITH SIPS OF WATER:  levothyroxine (SYNTHROID)   No Alcohol for 24 hours before or after surgery.  No Smoking including e-cigarettes for 24 hours before surgery.  No chewable tobacco  products for at least 6 hours before surgery.  No nicotine patches on the day of surgery.  Do not use any recreational drugs for at least a week (preferably 2 weeks) before your surgery.  Please be advised that the combination of cocaine and anesthesia may have negative outcomes, up to and including death. If you test positive for cocaine, your surgery will be cancelled.  On the morning of surgery brush your teeth with toothpaste and water, you may rinse your mouth with mouthwash if you wish. Do not swallow any toothpaste or mouthwash.  Use CHG Soap or wipes as directed on instruction sheet.  Do not wear jewelry, make-up, hairpins, clips or nail polish.  For welded (permanent) jewelry: bracelets, anklets, waist bands, etc.  Please have this removed prior to surgery.  If it is not removed, there is a chance that hospital personnel will need to cut it off on the day of surgery.  Do not wear lotions, powders, or perfumes.   Do not shave body hair from the neck down 48 hours before surgery.  Contact lenses, hearing aids and dentures may not be worn into surgery.  Do not bring valuables to the hospital. [facility] is not responsible for any missing/lost belongings or valuables.   Notify your doctor if there is any change in your medical condition (cold, fever, infection).  Wear comfortable clothing (specific to your surgery type) to the hospital.  After surgery, you can help prevent lung complications by doing breathing exercises.  Take deep breaths and cough every  1-2 hours. Your doctor may order a device called an Incentive Spirometer to help you take deep breaths.  If you are being admitted to the hospital overnight, leave your suitcase in the car. After surgery it may be brought to your room.  In case of increased patient census, it may be necessary for you, the patient, to continue your postoperative care in the Same Day Surgery department.  If you are being discharged the day  of surgery, you will not be allowed to drive home. You will need a responsible individual to drive you home and stay with you for 24 hours after surgery.   If you are taking public transportation, you will need to have a responsible individual with you.  Please call the Pre-admissions Testing Dept. at [phone] if you have any questions about these instructions.  Surgery Visitation Policy:  Patients having surgery or a procedure may have two visitors.  Children under the age of 28 must have an adult with them who is not the patient.  Inpatient Visitation:    Visiting hours are 7 a.m. to 8 p.m. Up to four visitors are allowed at one time in a patient room. The visitors may rotate out with other people during the day.  One visitor age 32 or older may stay with the patient overnight and must be in the room by 8 p.m.   Merchandiser, Retail to address health-related social needs:  [email or web address]                                                                                                             Preparing for Surgery with CHLORHEXIDINE GLUCONATE (CHG) Soap  Chlorhexidine Gluconate (CHG) Soap  o An antiseptic cleaner that kills germs and bonds with the skin to continue killing germs even after washing  o Used for showering the night before surgery and morning of surgery  Before surgery, you can play an important role by reducing the number of germs on your skin.  CHG (Chlorhexidine gluconate) soap is an antiseptic cleanser which kills germs and bonds with the skin to continue killing germs even after washing.  Please do not use if you have an allergy to CHG or antibacterial soaps. If your skin becomes reddened/irritated stop using the CHG.  1. Shower the NIGHT BEFORE SURGERY with CHG soap.  2. If you choose to wash your hair, wash your hair first as usual with your normal shampoo.  3. After shampooing, rinse your hair and body thoroughly to  remove the shampoo.  4. Use CHG as you would any other liquid soap. You can apply CHG directly to the skin and wash gently with a clean washcloth.  5. Apply the CHG soap to your body only from the neck down. Do not use on open wounds or open sores. Avoid contact with your eyes, ears, mouth, and genitals (private parts). Wash face and genitals (private parts) with your normal soap.  6. Wash thoroughly, paying special attention to the area where your surgery will be performed.  7.  Thoroughly rinse your body with warm water.  8. Do not shower/wash with your normal soap after using and rinsing off the CHG soap.  9. Do not use lotions, oils, etc., after showering with CHG.  10. Pat yourself dry with a clean towel.  11. Wear clean pajamas to bed the night before surgery.  12. Place clean sheets on your bed the night of your shower and do not sleep with pets.  13. Do not apply any deodorants/lotions/powders.  14. Please wear clean clothes to the hospital.  15. Remember to brush your teeth with your regular toothpaste.

## 2024-07-29 ENCOUNTER — Ambulatory Visit: Payer: Self-pay

## 2024-07-29 ENCOUNTER — Other Ambulatory Visit: Payer: Self-pay

## 2024-07-29 ENCOUNTER — Ambulatory Visit
Admission: RE | Admit: 2024-07-29 | Discharge: 2024-07-29 | Disposition: A | Discharge status: Home / Self Care | Attending: Podiatry | Admitting: Podiatry

## 2024-07-29 ENCOUNTER — Encounter: Payer: Self-pay | Admitting: Podiatry

## 2024-07-29 ENCOUNTER — Encounter
Admission: RE | Disposition: A | Discharge status: Home / Self Care | Payer: Self-pay | Source: Home / Self Care | Attending: Podiatry

## 2024-07-29 ENCOUNTER — Ambulatory Visit

## 2024-07-29 DIAGNOSIS — M7661 Achilles tendinitis, right leg: Secondary | ICD-10-CM

## 2024-07-29 HISTORY — PX: OSTECTOMY: SHX6439

## 2024-07-29 HISTORY — PX: ACHILLES TENDON SURGERY: SHX542

## 2024-07-29 SURGERY — REPAIR, TENDON, ACHILLES
Anesthesia: General | Site: Heel | Laterality: Right

## 2024-07-29 MED ORDER — MIDAZOLAM HCL 2 MG/2ML IJ SOLN
INTRAMUSCULAR | Status: AC
  Filled 2024-07-29: qty 2

## 2024-07-29 MED ORDER — CHLORHEXIDINE GLUCONATE 0.12 % MT SOLN
OROMUCOSAL | Status: AC
  Filled 2024-07-29: qty 15

## 2024-07-29 MED ORDER — ROCURONIUM BROMIDE 10 MG/ML (PF) SYRINGE
PREFILLED_SYRINGE | INTRAVENOUS | Status: AC
  Filled 2024-07-29: qty 10

## 2024-07-29 MED ORDER — BUPIVACAINE LIPOSOME 1.3 % IJ SUSP
INTRAMUSCULAR | Status: AC
  Filled 2024-07-29: qty 20

## 2024-07-29 MED ORDER — 0.9 % SODIUM CHLORIDE (POUR BTL) OPTIME
TOPICAL | Status: DC | PRN
  Administered 2024-07-29 (×2): 500 mL

## 2024-07-29 MED ORDER — FENTANYL CITRATE (PF) 100 MCG/2ML IJ SOLN
INTRAMUSCULAR | Status: AC
  Filled 2024-07-29: qty 2

## 2024-07-29 MED ORDER — ROCURONIUM BROMIDE 100 MG/10ML IV SOLN
INTRAVENOUS | Status: DC | PRN
  Administered 2024-07-29: 50 mg via INTRAVENOUS

## 2024-07-29 MED ORDER — GLYCOPYRROLATE 0.2 MG/ML IJ SOLN
INTRAMUSCULAR | Status: AC
  Filled 2024-07-29: qty 1

## 2024-07-29 MED ORDER — MIDAZOLAM HCL (PF) 2 MG/2ML IJ SOLN
INTRAMUSCULAR | Status: DC | PRN
  Administered 2024-07-29: 2 mg via INTRAVENOUS

## 2024-07-29 MED ORDER — GLYCOPYRROLATE 0.2 MG/ML IJ SOLN
INTRAMUSCULAR | Status: DC | PRN
  Administered 2024-07-29: .1 mg via INTRAVENOUS

## 2024-07-29 MED ORDER — CEFAZOLIN SODIUM-DEXTROSE 2-4 GM/100ML-% IV SOLN
2.0000 g | INTRAVENOUS | Status: AC
  Administered 2024-07-29: 2 g via INTRAVENOUS

## 2024-07-29 MED ORDER — SODIUM CHLORIDE 0.9 % IR SOLN
Status: DC | PRN
  Administered 2024-07-29: 250 mL

## 2024-07-29 MED ORDER — IBUPROFEN 400 MG PO TABS: 400.0000 mg | ORAL_TABLET | Freq: Four times a day (QID) | ORAL | 0 refills | Status: AC | PRN

## 2024-07-29 MED ORDER — ASPIRIN 81 MG PO TBEC: 81.0000 mg | DELAYED_RELEASE_TABLET | Freq: Two times a day (BID) | ORAL | 0 refills | Status: AC

## 2024-07-29 MED ORDER — ACETAMINOPHEN 10 MG/ML IV SOLN
INTRAVENOUS | Status: DC | PRN
  Administered 2024-07-29: 1000 mg via INTRAVENOUS

## 2024-07-29 MED ORDER — HYDROCODONE-ACETAMINOPHEN 7.5-325 MG PO TABS: 1.0000 | ORAL_TABLET | Freq: Four times a day (QID) | ORAL | 0 refills | Status: AC | PRN

## 2024-07-29 MED ORDER — LIDOCAINE HCL (CARDIAC) PF 100 MG/5ML IV SOSY
PREFILLED_SYRINGE | INTRAVENOUS | Status: DC | PRN
  Administered 2024-07-29: 100 mg via INTRAVENOUS

## 2024-07-29 MED ORDER — ACETAMINOPHEN 10 MG/ML IV SOLN
INTRAVENOUS | Status: AC
  Filled 2024-07-29: qty 100

## 2024-07-29 MED ORDER — FENTANYL CITRATE (PF) 100 MCG/2ML IJ SOLN
INTRAMUSCULAR | Status: DC | PRN
  Administered 2024-07-29 (×2): 50 ug via INTRAVENOUS

## 2024-07-29 MED ORDER — LACTATED RINGERS IV SOLN: INTRAVENOUS | Status: DC | PRN

## 2024-07-29 MED ORDER — LIDOCAINE HCL (PF) 2 % IJ SOLN
INTRAMUSCULAR | Status: AC
Start: 2024-07-29 — End: 2024-07-29
  Filled 2024-07-29: qty 5

## 2024-07-29 MED ORDER — CEFAZOLIN SODIUM-DEXTROSE 2-4 GM/100ML-% IV SOLN
INTRAVENOUS | Status: AC
  Filled 2024-07-29: qty 100

## 2024-07-29 MED ORDER — CHLORHEXIDINE GLUCONATE 0.12 % MT SOLN
15.0000 mL | Freq: Once | OROMUCOSAL | Status: AC
  Administered 2024-07-29: 15 mL via OROMUCOSAL

## 2024-07-29 MED ORDER — BUPIVACAINE HCL 0.25 % IJ SOLN
INTRAMUSCULAR | Status: DC | PRN
  Administered 2024-07-29: 10 mL

## 2024-07-29 MED ORDER — ONDANSETRON HCL 4 MG/2ML IJ SOLN
INTRAMUSCULAR | Status: AC
  Filled 2024-07-29: qty 2

## 2024-07-29 MED ORDER — DEXAMETHASONE SOD PHOSPHATE PF 10 MG/ML IJ SOLN
INTRAMUSCULAR | Status: DC | PRN
  Administered 2024-07-29: 10 mg via INTRAVENOUS

## 2024-07-29 MED ORDER — KETAMINE HCL 50 MG/5ML IJ SOSY
PREFILLED_SYRINGE | INTRAMUSCULAR | Status: AC
  Filled 2024-07-29: qty 5

## 2024-07-29 MED ORDER — BUPIVACAINE HCL (PF) 0.25 % IJ SOLN
INTRAMUSCULAR | Status: AC
  Filled 2024-07-29: qty 30

## 2024-07-29 MED ORDER — SUGAMMADEX SODIUM 200 MG/2ML IV SOLN
INTRAVENOUS | Status: DC | PRN
  Administered 2024-07-29: 200 mg via INTRAVENOUS

## 2024-07-29 MED ORDER — LACTATED RINGERS IV SOLN: INTRAVENOUS | Status: DC

## 2024-07-29 MED ORDER — KETAMINE HCL 50 MG/5ML IJ SOSY
PREFILLED_SYRINGE | INTRAMUSCULAR | Status: DC | PRN
Start: 2024-07-29 — End: 2024-07-29
  Administered 2024-07-29: 10 mg via INTRAVENOUS
  Administered 2024-07-29: 20 mg via INTRAVENOUS

## 2024-07-29 MED ORDER — FENTANYL CITRATE (PF) 100 MCG/2ML IJ SOLN
25.0000 ug | INTRAMUSCULAR | Status: DC | PRN
  Administered 2024-07-29 (×4): 25 ug via INTRAVENOUS

## 2024-07-29 MED ORDER — ORAL CARE MOUTH RINSE: 15.0000 mL | Freq: Once | OROMUCOSAL | Status: AC

## 2024-07-29 MED ORDER — PROPOFOL 10 MG/ML IV BOLUS
INTRAVENOUS | Status: AC
  Filled 2024-07-29: qty 20

## 2024-07-29 MED ORDER — ONDANSETRON HCL 4 MG/2ML IJ SOLN
INTRAMUSCULAR | Status: DC | PRN
  Administered 2024-07-29: 4 mg via INTRAVENOUS

## 2024-07-29 MED ORDER — PROPOFOL 10 MG/ML IV BOLUS
INTRAVENOUS | Status: DC | PRN
  Administered 2024-07-29: 150 mg via INTRAVENOUS

## 2024-07-29 SURGICAL SUPPLY — 34 items
ANCHOR SPDBRG KL ACHILLES 3.9 (Anchor) IMPLANT
BLADE MED AGGRESSIVE (BLADE) IMPLANT
BLADE SURG 15 STRL LF DISP TIS (BLADE) IMPLANT
BLADE SURG MINI STRL (BLADE) IMPLANT
BNDG ELASTIC 4X5.8 VLCR NS LF (GAUZE/BANDAGES/DRESSINGS) ×6 IMPLANT
BNDG ESMARCH 4X12 STRL LF (GAUZE/BANDAGES/DRESSINGS) ×3 IMPLANT
BNDG GAUZE DERMACEA FLUFF 4 (GAUZE/BANDAGES/DRESSINGS) ×3 IMPLANT
BNDG STRETCH 4X75 STRL LF (GAUZE/BANDAGES/DRESSINGS) ×3 IMPLANT
COVER LIGHT HANDLE STERIS (MISCELLANEOUS) ×6 IMPLANT
CUFF TRNQT CYL 34X4.125X (TOURNIQUET CUFF) IMPLANT
DRAPE FLUOR MINI C-ARM 54X84 (DRAPES) ×3 IMPLANT
DURAPREP 26ML APPLICATOR (WOUND CARE) ×3 IMPLANT
ELECTRODE REM PT RTRN 9FT ADLT (ELECTROSURGICAL) ×3 IMPLANT
GAUZE SPONGE 4X4 12PLY STRL (GAUZE/BANDAGES/DRESSINGS) ×3 IMPLANT
GAUZE XEROFORM 1X8 LF (GAUZE/BANDAGES/DRESSINGS) ×3 IMPLANT
GLOVE BIO SURGEON STRL SZ7 (GLOVE) ×3 IMPLANT
GLOVE BIOGEL PI IND STRL 7.5 (GLOVE) ×3 IMPLANT
GOWN STRL REUS W/ TWL LRG LVL3 (GOWN DISPOSABLE) ×6 IMPLANT
KIT TURNOVER KIT A (KITS) ×3 IMPLANT
MANIFOLD NEPTUNE II (INSTRUMENTS) ×3 IMPLANT
NS IRRIG 500ML POUR BTL (IV SOLUTION) ×3 IMPLANT
PACK EXTREMITY ARMC (MISCELLANEOUS) ×3 IMPLANT
PADDING CAST BLEND 4X4 NS (MISCELLANEOUS) ×9 IMPLANT
PENCIL SMOKE EVACUATOR (MISCELLANEOUS) ×6 IMPLANT
RASP SM TEAR CROSS CUT (RASP) IMPLANT
SPLINT CAST 1 STEP 4X30 (MISCELLANEOUS) ×3 IMPLANT
STOCKINETTE IMPERVIOUS 9X36 MD (GAUZE/BANDAGES/DRESSINGS) ×3 IMPLANT
SUT ETHILON NAB PS2 4-0 18IN (SUTURE) ×3 IMPLANT
SUT MNCRL AB 4-0 PS2 18 (SUTURE) IMPLANT
SUT VIC AB 3-0 SH 27X BRD (SUTURE) IMPLANT
SUTURE EHLN 3-0 FS-10 30 BLK (SUTURE) ×3 IMPLANT
TRAP FLUID SMOKE EVACUATOR (MISCELLANEOUS) ×3 IMPLANT
WAND TOPAZ MICRO DEBRIDER (MISCELLANEOUS) IMPLANT
WATER STERILE IRR 500ML POUR (IV SOLUTION) ×3 IMPLANT

## 2024-07-29 NOTE — H&P (Signed)
 HISTORY AND PHYSICAL INTERVAL NOTE:  07/29/2024  7:21 AM  Abigail Lawson  has presented today for surgery, with the diagnosis of Tendonitis, Achilles, right M76.61 Calcaneal spur, right M77.31 Osteophyte of right foot M25.774 Other synovitis and tenosynovitis, right ankle and foot M65.871 Short Achilles tendon, right M67.01.  The various methods of treatment have been discussed with the patient.  No guarantees were given.  After consideration of risks, benefits and other options for treatment, the patient has consented to surgery.  I have reviewed the patients' chart and labs.    PROCEDURE: ALL RIGHT SIDE ACHILLES TENDON DEBRIDEMENT WITH PARTIAL DETACHMENT AND REATTACHEMENT REMOVAL OF HEEL SPUR POSSIBLE GASTROC RECESSION USE OF INTRA-OP FLUOROSCOPY   A history and physical examination was performed in my office.  The patient was reexamined.  There have been no changes to this history and physical examination.  Prentice Lee, DPM

## 2024-07-29 NOTE — Anesthesia Procedure Notes (Signed)
 Procedure Name: Intubation Date/Time: 07/29/2024 7:39 AM  Performed by: Jackye Spanner, CRNAPre-anesthesia Checklist: Patient identified, Patient being monitored, Timeout performed, Emergency Drugs available and Suction available Patient Re-evaluated:Patient Re-evaluated prior to induction Oxygen Delivery Method: Circle system utilized Preoxygenation: Pre-oxygenation with 100% oxygen Induction Type: IV induction Ventilation: Mask ventilation without difficulty Laryngoscope Size: 3 and McGrath Grade View: Grade I Tube type: Oral Tube size: 7.0 mm Number of attempts: 1 Airway Equipment and Method: Stylet Placement Confirmation: ETT inserted through vocal cords under direct vision, positive ETCO2 and breath sounds checked- equal and bilateral Secured at: 22 cm Tube secured with: Tape Dental Injury: Teeth and Oropharynx as per pre-operative assessment  Comments: Smooth atraumatic intubation, no complications noted.

## 2024-07-29 NOTE — Op Note (Signed)
 PODIATRY / FOOT AND ANKLE SURGERY OPERATIVE REPORT    SURGEON: Prentice Lee, DPM  PRE-OPERATIVE DIAGNOSIS:  1.  Right insertional Achilles tendinitis 2.  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  Large Haglund's deformity and posterior heel spur with intratendinous calcification and increased thickness of tendon  PATHOLOGY/SPECIMEN(S): None  INDICATIONS:   Abigail Lawson is a 53 y.o. female who presents with chronic posterior right heel pain.  Patient has treated this conservatively for a number of months without much improvement.  Patient has exhausted all conservative measures today and presents today for surgical intervention as described above and below.  All treatment options were discussed with the patient both conservative and surgical attempts at correction include potential risks and complications and at this time patient is elected for surgical procedure.  No guarantees given.  Consent obtained prior to procedure.  DESCRIPTION: After obtaining full informed written consent, the patient was brought back to the operating room and placed prone upon the operating table after obtaining adequate anesthesia and received IV antibiotics prior to induction.  The patient was prepped and draped in the standard fashion.  Attention was directed to the posterior aspect of the right heel at the insertion point of the Achilles tendon onto the posterior heel where there was a large bony prominence palpable to this area with increased thickness of the tendon.  An incision was made at the midline of this area.  The incision was deepened through the  subcutaneous tissues utilizing sharp and blunt dissection and care was taken to identify and retract all vital neural and vascular structures and all venous contributories were cauterized necessary.  At this time the peritenon was then incised and reflected medially and laterally thereby exposing the Achilles tendon at the operative site.  There appeared to be some overlying synovitis present to the tendon which was resected and passed off the site.  A midline incision was then made straight to the Achilles tendon along the course the incision line and was T-ed at the insertion point.  The Achilles tendon was then reflected off of the calcaneus with full-thickness flaps.  This appeared to expose the large bone spur present at the posterior aspect of the heel as well as Haglund's deformity.  The Haglund's deformity and bone spur at the posterior was resected with a sagittal bone saw and passed off the operative site.  This area was then smoothed with the paddle rasp.  This was checked under fluoroscopic guidance multiple times ensuring the proper resection was obtained.  Resection appeared to be excellent overall both clinically and radiographically.  At this time the Achilles tendon was then debrided removing any thickened tendon at the insertion point to glistening white fibers.  There appeared to be some thickness within the tendon with some fatty type changes near the insertion point.  This was all resected and passed off the operative site.  The remaining tendon appeared to be white and glistening with the fibers that appear to be within normal limits.  There also appeared to be a bony and cartilaginous type of substance within the section of the Achilles tendon near the insertion point which was resected and passed off the operative site.  The tendon appeared to  be within normal limits now but further debridement was performed with the Topaz micro debridement wand.  This completed the debridement of the  Achilles tendon.  The surgical site was flushed with copious amounts normal sterile saline.  At this time the distal holes remained for the 2 Arthrex anchors.  The proximal row of anchors was then placed slightly anterior and superior to this, 2 anchors were placed in this area that were all suture.  The suture from the medial anchor was then placed through the medial tendon and the suture from the lateral anchor that was placed was then placed through the lateral tendon with the appropriate orientation.  The surgical site was flushed with copious amounts normal sterile saline.  The ripstop suture was then applied reapproximating the Achilles tendon split and adding another point of fixation.  At this time one of the suture strands from the medial anchor and one of the strands from the lateral anchor were then placed into the one of the distal holes.  This was placed under appropriate tension.  This same procedure was then performed with the other distal hole utilizing one of the sutures from the medial and lateral anchors that was placed in the proximal row to the distal anchor hole.  Appropriate seating was applied to both anchors overall.  This appeared to reapproximate the Achilles tendon with very little footprint.  The suture was then cut flush with the bone and tendon.  The Achilles tendon was then checked for any increased thickness and some more of the thickness was removed at the medial and lateral margins creating a smooth surface with no rough edges.  At this time the Achilles tendon split was then reapproximated coapted with a running 3-0 vicryl interconnected stitch.  The peritenon was reapproximated well coapted with 4-0 Monocryl in a running interconnected stitch.  The subcutaneous tissue was reapproximated well coapted with 4-0 Monocryl.  The skin was then reapproximated well coapted with 4-0 nylon in horizontal mattress type stitching.  The pneumatic thigh tourniquet was deflated and a prompt  hyperemic response was noted all digits the right foot.  10 cc of quarter percent Marcaine plain was injected about the operative area.  Patient was to receive a postoperative popliteal block by anesthesia with Exparel.  A postoperative dressing was then applied consisting of Xeroform followed by 4 x 4 gauze, ABD pad, Kerlix, Webril, posterior splint, Ace wrap's.  The patient tolerated the procedure and anesthesia well and was transferred to the cover room with vital signs stable and vascular status intact to all toes of the right foot.  Following a period of postoperative monitoring the patient will be discharged home with the appropriate orders, instructions, and medications.  Patient is to remain nonweightbearing at all times to the right lower extremity for the next 2 to 3 weeks.  Patient to follow-up in outpatient clinic in 1 week  COMPLICATIONS: None  CONDITION: Good, stable  Prentice Lee, DPM

## 2024-07-29 NOTE — Transfer of Care (Signed)
 Immediate Anesthesia Transfer of Care Note  Patient: Abigail Lawson  Procedure(s) Performed: REPAIR, TENDON, ACHILLES (Right: Heel) OSTECTOMY (Right: Foot)  Patient Location: PACU  Anesthesia Type:General  Level of Consciousness: awake, alert , and drowsy  Airway & Oxygen Therapy: Patient Spontanous Breathing  Post-op Assessment: Report given to RN and Post -op Vital signs reviewed and stable  Post vital signs: Reviewed and stable  Last Vitals:  Vitals Value Taken Time  BP 131/64 07/29/24 09:35  Temp 97.8 0935  Pulse 80 07/29/24 09:37  Resp 20 07/29/24 09:37  SpO2 100 % 07/29/24 09:37  Vitals shown include unfiled device data.  Last Pain:  Vitals:   07/29/24 0628  PainSc: 0-No pain         Complications: No notable events documented.

## 2024-07-29 NOTE — Anesthesia Preprocedure Evaluation (Signed)
 Anesthesia Evaluation  Patient identified by MRN, date of birth, ID band Patient awake    Reviewed: Allergy & Precautions, NPO status , Patient's Chart, lab work & pertinent test results  Airway Mallampati: III  TM Distance: >3 FB Neck ROM: Full    Dental  (+) Teeth Intact   Pulmonary neg pulmonary ROS, Patient abstained from smoking., former smoker   Pulmonary exam normal breath sounds clear to auscultation       Cardiovascular Exercise Tolerance: Good negative cardio ROS Normal cardiovascular exam Rhythm:Regular Rate:Normal     Neuro/Psych  Headaches negative neurological ROS  negative psych ROS   GI/Hepatic negative GI ROS, Neg liver ROS,GERD  ,,  Endo/Other  negative endocrine ROSHypothyroidism    Renal/GU      Musculoskeletal   Abdominal   Peds  Hematology negative hematology ROS (+) Blood dyscrasia, anemia   Anesthesia Other Findings Patient declined block saying it wasn't necessary.   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 No date: Hypothyroidism No date: Thyroid  disease  Past Surgical History: No date: ABDOMINAL HYSTERECTOMY 07/09/2015: BILATERAL SALPINGECTOMY     Comment:  Procedure: BILATERAL SALPINGECTOMY;  Surgeon: Debby JINNY Dinsmore, MD;  Location: [facility] ORS;  Service:               Gynecology;; 2013: CHOLECYSTECTOMY 07/09/2015: OOPHORECTOMY; Right     Comment:  Procedure: OOPHORECTOMY;  Surgeon: Debby JINNY Dinsmore, MD;  Location: [facility] OR[facility];  Service:               Gynecology;  Laterality: Right; No date: thyroidectomy     Comment:  total 1997: TONSILLECTOMY No date: TUBAL LIGATION 07/09/2015: VAGINAL HYSTERECTOMY; N/A     Comment:   Procedure: HYSTERECTOMY VAGINAL;  Surgeon: Debby JINNY Dinsmore, MD;  Location: [facility] OR[facility];  Service:               Gynecology;  Laterality: N/A;  BMI    Body Mass Index: 34.12 kg/m      Reproductive/Obstetrics negative OB ROS                              Anesthesia Physical Anesthesia Plan  ASA: 2  Anesthesia Plan:    Post-op Pain Management:    Induction: Intravenous  PONV Risk Score and Plan: 2 and Ondansetron , Dexamethasone  and Midazolam   Airway Management Planned: LMA  Additional Equipment:   Intra-op Plan:   Post-operative Plan: Extubation in OR  Informed Consent: I have reviewed the patients History and Physical, chart, labs and discussed the procedure including the risks, benefits and alternatives for the proposed anesthesia with the patient or authorized representative who has indicated his/her understanding and acceptance.     Dental Advisory Given  Plan Discussed with: Anesthesiologist, CRNA and Surgeon  Anesthesia Plan Comments: (Patient consented for risks of anesthesia including but not limited to:  - adverse reactions to medications - damage to eyes, teeth, lips or other oral mucosa - nerve damage due to positioning  -  sore throat or hoarseness - Damage to heart, brain, nerves, lungs, other parts of body or loss of life  Patient voiced understanding and assent.)        Anesthesia Quick Evaluation

## 2024-07-29 NOTE — Discharge Instructions (Addendum)
 [facility]  POST OPERATIVE INSTRUCTIONS FOR DR. ASHLEY AND DR. Dajai Wahlert [facility] PODIATRY DEPARTMENT   Take your medication as prescribed.  Pain medication should be taken only as needed.  May take ibuprofen  as needed, max dose is 3200 mg/day, try to take this sparingly as too much can slow wound and tendon healing.  May also take acetaminophen  or Tylenol  as needed max dose 4000 mg/day.  If still having pain, may take prescription pain medication 2 times every 6 hours.  If still having pain then try taking 2 tablets every 4 hours.  Pain is normal after surgery and would be expected.  Pain should be manageable with medications.  Keep the dressing clean, dry and intact.  Remain nonweightbearing at all times to the right lower extremity.  May use crutches, knee scooter, or wheelchair to get around.   Keep your foot elevated above the heart level for the first 48 hours.  Continue elevation thereafter to improve swelling.  May also apply ice to the back of the right knee for maximum 10 minutes of every 1 hour as needed for pain and swelling.  Walking to the bathroom and brief periods of walking are acceptable, unless we have instructed you to be non-weight bearing.  Always wear your post-op shoe when walking.  Always use your crutches if you are to be non-weight bearing.  Do not take a shower. Baths are permissible as long as the foot is kept out of the water.   Every hour you are awake:  Bend your knee 15 times. Massage calf 15 times  Call [facility] [phone]) if any of the following problems occur: You develop a temperature or fever. The bandage becomes saturated with blood. Medication does not stop your pain. Injury of the foot occurs. Any symptoms of infection including redness, odor, or red streaks running from wound.

## 2024-07-29 NOTE — Anesthesia Postprocedure Evaluation (Signed)
 Anesthesia Post Note  Patient: Abigail Lawson  Procedure(s) Performed: REPAIR, TENDON, ACHILLES (Right: Heel) OSTECTOMY (Right: Foot)  Patient location during evaluation: PACU Anesthesia Type: General Level of consciousness: awake and alert Pain management: pain level controlled Vital Signs Assessment: post-procedure vital signs reviewed and stable Respiratory status: spontaneous breathing, nonlabored ventilation, respiratory function stable and patient connected to nasal cannula oxygen Cardiovascular status: blood pressure returned to baseline and stable Postop Assessment: no apparent nausea or vomiting Anesthetic complications: no Comments: Patient had declined a pre op popliteal block but after talking to the surgeon, agreed to a regional block. Due to the timing, we agreed to perform the block after the case. The patient is in the PACU and again is declining the block. Rates her pain a 4/10 currently. Patient is aware that we will perform the block if she changes her mind.    No notable events documented.   Last Vitals:  Vitals:   07/29/24 0935 07/29/24 0945  BP: 131/64 122/63  Pulse: 80 71  Resp: 14 (!) 21  Temp: 36.6 C   SpO2: 100% 100%    Last Pain:  Vitals:   07/29/24 1009  PainSc: 4                  Debby [place]

## 2024-08-01 ENCOUNTER — Encounter: Payer: Self-pay | Admitting: Podiatry
